# Patient Record
Sex: Female | Born: 1960 | Race: White | Hispanic: No | Marital: Single | Smoking: Current every day smoker
Health system: Southern US, Community
[De-identification: ages and names within clinical notes are randomized; demographics above are authoritative.]

## PROBLEM LIST (undated history)

## (undated) DIAGNOSIS — G4733 Obstructive sleep apnea (adult) (pediatric): Secondary | ICD-10-CM

## (undated) DIAGNOSIS — F32A Depression, unspecified: Secondary | ICD-10-CM

## (undated) DIAGNOSIS — I639 Cerebral infarction, unspecified: Secondary | ICD-10-CM

## (undated) DIAGNOSIS — K900 Celiac disease: Secondary | ICD-10-CM

## (undated) DIAGNOSIS — E039 Hypothyroidism, unspecified: Secondary | ICD-10-CM

## (undated) DIAGNOSIS — K219 Gastro-esophageal reflux disease without esophagitis: Secondary | ICD-10-CM

## (undated) DIAGNOSIS — E119 Type 2 diabetes mellitus without complications: Secondary | ICD-10-CM

## (undated) DIAGNOSIS — M5136 Other intervertebral disc degeneration, lumbar region: Secondary | ICD-10-CM

## (undated) DIAGNOSIS — J45909 Unspecified asthma, uncomplicated: Secondary | ICD-10-CM

## (undated) DIAGNOSIS — I10 Essential (primary) hypertension: Secondary | ICD-10-CM

## (undated) DIAGNOSIS — E7800 Pure hypercholesterolemia, unspecified: Secondary | ICD-10-CM

## (undated) DIAGNOSIS — M1990 Unspecified osteoarthritis, unspecified site: Secondary | ICD-10-CM

## (undated) DIAGNOSIS — B029 Zoster without complications: Secondary | ICD-10-CM

## (undated) DIAGNOSIS — R0602 Shortness of breath: Secondary | ICD-10-CM

## (undated) DIAGNOSIS — K649 Unspecified hemorrhoids: Secondary | ICD-10-CM

## (undated) DIAGNOSIS — I2510 Atherosclerotic heart disease of native coronary artery without angina pectoris: Secondary | ICD-10-CM

## (undated) DIAGNOSIS — F329 Major depressive disorder, single episode, unspecified: Secondary | ICD-10-CM

## (undated) DIAGNOSIS — Z87442 Personal history of urinary calculi: Secondary | ICD-10-CM

## (undated) DIAGNOSIS — G5600 Carpal tunnel syndrome, unspecified upper limb: Secondary | ICD-10-CM

## (undated) DIAGNOSIS — I739 Peripheral vascular disease, unspecified: Secondary | ICD-10-CM

## (undated) DIAGNOSIS — J449 Chronic obstructive pulmonary disease, unspecified: Secondary | ICD-10-CM

## (undated) DIAGNOSIS — R202 Paresthesia of skin: Secondary | ICD-10-CM

## (undated) HISTORY — DX: Obstructive sleep apnea (adult) (pediatric): G47.33

## (undated) HISTORY — DX: Atherosclerotic heart disease of native coronary artery without angina pectoris: I25.10

## (undated) HISTORY — DX: Zoster without complications: B02.9

## (undated) HISTORY — DX: Other intervertebral disc degeneration, lumbar region: M51.36

## (undated) HISTORY — DX: Pure hypercholesterolemia, unspecified: E78.00

## (undated) HISTORY — DX: Paresthesia of skin: R20.2

## (undated) HISTORY — DX: Peripheral vascular disease, unspecified: I73.9

## (undated) HISTORY — DX: Celiac disease: K90.0

## (undated) HISTORY — PX: CYSTOSCOPY: SUR368

## (undated) HISTORY — DX: Carpal tunnel syndrome, unspecified upper limb: G56.00

## (undated) HISTORY — PX: URETERAL STENT PLACEMENT: SHX822

## (undated) HISTORY — PX: INCONTINENCE SURGERY: SHX676

---

## 2006-03-18 HISTORY — PX: ABDOMINAL HYSTERECTOMY: SHX81

## 2008-02-18 DIAGNOSIS — K649 Unspecified hemorrhoids: Secondary | ICD-10-CM

## 2008-02-18 HISTORY — DX: Unspecified hemorrhoids: K64.9

## 2008-09-04 HISTORY — PX: CHOLECYSTECTOMY: SHX55

## 2010-03-16 ENCOUNTER — Encounter: Admission: RE | Admit: 2010-03-16 | Discharge: 2010-03-16 | Payer: Self-pay | Admitting: Neurosurgery

## 2010-03-30 ENCOUNTER — Encounter: Admission: RE | Admit: 2010-03-30 | Discharge: 2010-03-30 | Payer: Self-pay | Admitting: Neurosurgery

## 2010-09-19 ENCOUNTER — Other Ambulatory Visit: Payer: Self-pay | Admitting: Neurosurgery

## 2010-09-19 DIAGNOSIS — M545 Low back pain: Secondary | ICD-10-CM

## 2010-09-21 ENCOUNTER — Ambulatory Visit
Admission: RE | Admit: 2010-09-21 | Discharge: 2010-09-21 | Disposition: A | Discharge status: Home / Self Care | Payer: Medicaid Other | Source: Ambulatory Visit | Attending: Neurosurgery | Admitting: Neurosurgery

## 2010-09-21 DIAGNOSIS — M545 Low back pain: Secondary | ICD-10-CM

## 2013-06-25 DIAGNOSIS — E119 Type 2 diabetes mellitus without complications: Secondary | ICD-10-CM | POA: Insufficient documentation

## 2014-02-09 ENCOUNTER — Other Ambulatory Visit: Payer: Self-pay | Admitting: Neurosurgery

## 2014-02-24 ENCOUNTER — Encounter (HOSPITAL_COMMUNITY)
Admission: RE | Admit: 2014-02-24 | Discharge: 2014-02-24 | Disposition: A | Discharge status: Home / Self Care | Payer: Medicare Other | Source: Ambulatory Visit | Attending: Neurosurgery | Admitting: Neurosurgery

## 2014-02-24 ENCOUNTER — Encounter (HOSPITAL_COMMUNITY): Payer: Self-pay

## 2014-02-24 ENCOUNTER — Encounter (HOSPITAL_COMMUNITY): Payer: Self-pay | Admitting: Pharmacy Technician

## 2014-02-24 DIAGNOSIS — Z01812 Encounter for preprocedural laboratory examination: Secondary | ICD-10-CM | POA: Diagnosis not present

## 2014-02-24 HISTORY — DX: Unspecified hemorrhoids: K64.9

## 2014-02-24 HISTORY — DX: Gastro-esophageal reflux disease without esophagitis: K21.9

## 2014-02-24 HISTORY — DX: Cerebral infarction, unspecified: I63.9

## 2014-02-24 HISTORY — DX: Depression, unspecified: F32.A

## 2014-02-24 HISTORY — DX: Chronic obstructive pulmonary disease, unspecified: J44.9

## 2014-02-24 HISTORY — DX: Essential (primary) hypertension: I10

## 2014-02-24 HISTORY — DX: Major depressive disorder, single episode, unspecified: F32.9

## 2014-02-24 HISTORY — DX: Hypothyroidism, unspecified: E03.9

## 2014-02-24 HISTORY — DX: Unspecified osteoarthritis, unspecified site: M19.90

## 2014-02-24 HISTORY — DX: Type 2 diabetes mellitus without complications: E11.9

## 2014-02-24 HISTORY — DX: Unspecified asthma, uncomplicated: J45.909

## 2014-02-24 HISTORY — DX: Shortness of breath: R06.02

## 2014-02-24 HISTORY — DX: Personal history of urinary calculi: Z87.442

## 2014-02-24 LAB — CBC
HEMATOCRIT: 39.7 % (ref 36.0–46.0)
Hemoglobin: 13.1 g/dL (ref 12.0–15.0)
MCH: 30.3 pg (ref 26.0–34.0)
MCHC: 33 g/dL (ref 30.0–36.0)
MCV: 91.9 fL (ref 78.0–100.0)
Platelets: 236 10*3/uL (ref 150–400)
RBC: 4.32 MIL/uL (ref 3.87–5.11)
RDW: 13.5 % (ref 11.5–15.5)
WBC: 11.3 10*3/uL — ABNORMAL HIGH (ref 4.0–10.5)

## 2014-02-24 LAB — BASIC METABOLIC PANEL
Anion gap: 16 — ABNORMAL HIGH (ref 5–15)
BUN: 17 mg/dL (ref 6–23)
CALCIUM: 9.2 mg/dL (ref 8.4–10.5)
CHLORIDE: 96 meq/L (ref 96–112)
CO2: 28 mEq/L (ref 19–32)
Creatinine, Ser: 1.01 mg/dL (ref 0.50–1.10)
GFR calc Af Amer: 72 mL/min — ABNORMAL LOW (ref >90)
GFR calc non Af Amer: 62 mL/min — ABNORMAL LOW (ref >90)
GLUCOSE: 297 mg/dL — AB (ref 70–99)
Potassium: 4.2 mEq/L (ref 3.7–5.3)
Sodium: 140 mEq/L (ref 137–147)

## 2014-02-24 LAB — SURGICAL PCR SCREEN
MRSA, PCR: NEGATIVE
STAPHYLOCOCCUS AUREUS: NEGATIVE

## 2014-02-24 NOTE — Pre-Procedure Instructions (Signed)
Sharon Calderon  02/24/2014   Your procedure is scheduled on: Friday, July 24.  [facility] at 9:15 AM.  Call this number if you have problems the morning of surgery: [phone]   Remember:   Do not eat food or drink liquids after midnight Thursday, July 23.   Take these medicines the morning of surgery with A SIP OF WATER: -   Do not wear jewelry, make-up or nail polish.  Do not wear lotions, powders, or perfumes.   Do not shave 48 hours prior to surgery.   Do not bring valuables to the hospital.             [facility] is not responsible for any belongings or valuables.               Contacts, dentures or bridgework may not be worn into surgery.  Leave suitcase in the car. After surgery it may be brought to your room.  For patients admitted to the hospital, discharge time is determined by your treatment team.               Patients discharged the day of surgery will not be allowed to drive home.  Name and phone number of your driver: -   Special Instructions: Review  [place] - Preparing For Surgery.   Please read over the following fact sheets that you were given: Pain Booklet, Coughing and Deep Breathing and Surgical Site Infection Prevention

## 2014-02-24 NOTE — Pre-Procedure Instructions (Addendum)
Sharon Calderon  02/24/2014   Your procedure is scheduled on: Friday, July 24.  [facility] at 9:15 AM.  Call this number if you have problems the morning of surgery: [phone]   Remember:   Do not eat food or drink liquids after midnight Thursday, July 23.   Take these medicines the morning of surgery with A SIP OF WATER: divalproex (DEPAKOTE ER), levothyroxine (SYNTHROID, LEVOTHROID), loratadine (CLARITIN),metoprolol tartrate (LOPRESSOR), omeprazole (PRILOSEC).               May take oxyCODONE-acetaminophen (PERCOCET/ROXICET) if needed.         Do not take any Aspirin, NSAID's (Advil, Ibuprofen, Aleve).        Phentermine should be stopped 2 weeks prior to surgery.     Stop taking Aspirin, Coumadin, Plavix, Effient and Herbal medications.  Do not take any NSAIDs ie: Ibuprofen,  Advil,Naproxen or any medication containing Aspirin.   Do not wear jewelry, make-up or nail polish.  Do not wear lotions, powders, or perfumes.   Do not shave 48 hours prior to surgery.   Do not bring valuables to the hospital.             [facility] is not [facility] for any belongings or valuables.               Contacts, dentures or bridgework may not be worn into surgery.  Leave suitcase in the car. After surgery it may be brought to your room.  For patients admitted to the hospital, discharge time is determined by your treatment team.               Patients discharged the day of surgery will not be allowed to drive home.  Name and phone number of your driver: -   Special Instructions: Review   - Preparing For Surgery.   Please read over the following fact sheets that you were given: Pain Booklet, Coughing and Deep Breathing and Surgical Site Infection Prevention

## 2014-02-24 NOTE — Progress Notes (Signed)
Sharon Calderon denies chest pain, does get SOB when walking has COPD.  [facility]. Patient reports that she has had  A TEE, EKG and Stress test in the past year.  Patient has had a sleep study in the past 2 years, I faxed request for all of these reports.  Sharon Calderon is on Phenetamine and took a dose today.  I instructed patient to not take any more. I notified Revonda Standardllison, who said that the anesthesia dept has said stopping Phenetamine 1 week prior to surgery is acceptable.

## 2014-02-25 NOTE — Progress Notes (Signed)
[facility],Stress,Echo.

## 2014-03-03 MED ORDER — CEFAZOLIN SODIUM-DEXTROSE 2-3 GM-% IV SOLR
2.0000 g | INTRAVENOUS | Status: AC
  Administered 2014-03-04: 2 g via INTRAVENOUS
  Filled 2014-03-03: qty 50

## 2014-03-04 ENCOUNTER — Observation Stay (HOSPITAL_COMMUNITY)
Admission: RE | Admit: 2014-03-04 | Discharge: 2014-03-05 | Disposition: A | Discharge status: Home / Self Care | Payer: Medicare Other | Source: Ambulatory Visit | Attending: Neurosurgery | Admitting: Neurosurgery

## 2014-03-04 ENCOUNTER — Encounter (HOSPITAL_COMMUNITY): Payer: Medicare Other | Admitting: Anesthesiology

## 2014-03-04 ENCOUNTER — Encounter (HOSPITAL_COMMUNITY)
Admission: RE | Disposition: A | Discharge status: Home / Self Care | Payer: Self-pay | Source: Ambulatory Visit | Attending: Neurosurgery

## 2014-03-04 ENCOUNTER — Inpatient Hospital Stay (HOSPITAL_COMMUNITY): Payer: Medicare Other

## 2014-03-04 ENCOUNTER — Encounter (HOSPITAL_COMMUNITY): Payer: Self-pay | Admitting: *Deleted

## 2014-03-04 ENCOUNTER — Inpatient Hospital Stay (HOSPITAL_COMMUNITY): Payer: Medicare Other | Admitting: Anesthesiology

## 2014-03-04 DIAGNOSIS — Z6839 Body mass index (BMI) 39.0-39.9, adult: Secondary | ICD-10-CM | POA: Diagnosis not present

## 2014-03-04 DIAGNOSIS — F3289 Other specified depressive episodes: Secondary | ICD-10-CM | POA: Diagnosis not present

## 2014-03-04 DIAGNOSIS — I10 Essential (primary) hypertension: Secondary | ICD-10-CM | POA: Insufficient documentation

## 2014-03-04 DIAGNOSIS — M47817 Spondylosis without myelopathy or radiculopathy, lumbosacral region: Secondary | ICD-10-CM | POA: Diagnosis not present

## 2014-03-04 DIAGNOSIS — M5126 Other intervertebral disc displacement, lumbar region: Secondary | ICD-10-CM | POA: Diagnosis present

## 2014-03-04 DIAGNOSIS — E039 Hypothyroidism, unspecified: Secondary | ICD-10-CM | POA: Diagnosis not present

## 2014-03-04 DIAGNOSIS — E119 Type 2 diabetes mellitus without complications: Secondary | ICD-10-CM | POA: Insufficient documentation

## 2014-03-04 DIAGNOSIS — J45909 Unspecified asthma, uncomplicated: Secondary | ICD-10-CM | POA: Diagnosis not present

## 2014-03-04 DIAGNOSIS — K219 Gastro-esophageal reflux disease without esophagitis: Secondary | ICD-10-CM | POA: Diagnosis not present

## 2014-03-04 DIAGNOSIS — F329 Major depressive disorder, single episode, unspecified: Secondary | ICD-10-CM | POA: Diagnosis not present

## 2014-03-04 DIAGNOSIS — M48061 Spinal stenosis, lumbar region without neurogenic claudication: Secondary | ICD-10-CM | POA: Diagnosis present

## 2014-03-04 DIAGNOSIS — Z8673 Personal history of transient ischemic attack (TIA), and cerebral infarction without residual deficits: Secondary | ICD-10-CM | POA: Diagnosis not present

## 2014-03-04 DIAGNOSIS — F17200 Nicotine dependence, unspecified, uncomplicated: Secondary | ICD-10-CM | POA: Diagnosis not present

## 2014-03-04 DIAGNOSIS — Z7982 Long term (current) use of aspirin: Secondary | ICD-10-CM | POA: Insufficient documentation

## 2014-03-04 HISTORY — PX: LUMBAR LAMINECTOMY/DECOMPRESSION MICRODISCECTOMY: SHX5026

## 2014-03-04 LAB — GLUCOSE, CAPILLARY
GLUCOSE-CAPILLARY: 190 mg/dL — AB (ref 70–99)
GLUCOSE-CAPILLARY: 155 mg/dL — AB (ref 70–99)
GLUCOSE-CAPILLARY: 168 mg/dL — AB (ref 70–99)
Glucose-Capillary: 165 mg/dL — ABNORMAL HIGH (ref 70–99)

## 2014-03-04 SURGERY — LUMBAR LAMINECTOMY/DECOMPRESSION MICRODISCECTOMY 3 LEVELS
Anesthesia: General | Laterality: Bilateral

## 2014-03-04 MED ORDER — ALBUTEROL SULFATE HFA 108 (90 BASE) MCG/ACT IN AERS
INHALATION_SPRAY | RESPIRATORY_TRACT | Status: AC
  Filled 2014-03-04: qty 6.7

## 2014-03-04 MED ORDER — SODIUM CHLORIDE 0.9 % IJ SOLN
3.0000 mL | Freq: Two times a day (BID) | INTRAMUSCULAR | Status: DC
  Administered 2014-03-04: 3 mL via INTRAVENOUS

## 2014-03-04 MED ORDER — ACETAMINOPHEN 650 MG RE SUPP: 650.0000 mg | RECTAL | Status: DC | PRN

## 2014-03-04 MED ORDER — FENTANYL CITRATE 0.05 MG/ML IJ SOLN
INTRAMUSCULAR | Status: DC | PRN
  Administered 2014-03-04: 50 ug via INTRAVENOUS
  Administered 2014-03-04: 100 ug via INTRAVENOUS

## 2014-03-04 MED ORDER — ROCURONIUM BROMIDE 100 MG/10ML IV SOLN
INTRAVENOUS | Status: DC | PRN
  Administered 2014-03-04: 10 mg via INTRAVENOUS
  Administered 2014-03-04: 30 mg via INTRAVENOUS

## 2014-03-04 MED ORDER — OXYCODONE-ACETAMINOPHEN 5-325 MG PO TABS
1.0000 | ORAL_TABLET | ORAL | Status: DC | PRN
  Administered 2014-03-05: 2 via ORAL
  Administered 2014-03-05: 1 via ORAL
  Filled 2014-03-04: qty 2
  Filled 2014-03-04: qty 1

## 2014-03-04 MED ORDER — PHENYLEPHRINE HCL 10 MG/ML IJ SOLN
10.0000 mg | INTRAVENOUS | Status: DC | PRN
  Administered 2014-03-04: 40 ug/min via INTRAVENOUS

## 2014-03-04 MED ORDER — PHENOL 1.4 % MT LIQD: 1.0000 | OROMUCOSAL | Status: DC | PRN

## 2014-03-04 MED ORDER — MIRABEGRON ER 50 MG PO TB24
50.0000 mg | ORAL_TABLET | Freq: Every day | ORAL | Status: DC
  Administered 2014-03-04 – 2014-03-05 (×2): 50 mg via ORAL
  Filled 2014-03-04 (×2): qty 1

## 2014-03-04 MED ORDER — GLYCOPYRROLATE 0.2 MG/ML IJ SOLN
INTRAMUSCULAR | Status: DC | PRN
  Administered 2014-03-04: 0.6 mg via INTRAVENOUS

## 2014-03-04 MED ORDER — ARTIFICIAL TEARS OP OINT
TOPICAL_OINTMENT | OPHTHALMIC | Status: AC
  Filled 2014-03-04: qty 3.5

## 2014-03-04 MED ORDER — SENNA 8.6 MG PO TABS
1.0000 | ORAL_TABLET | Freq: Two times a day (BID) | ORAL | Status: DC
  Administered 2014-03-04 – 2014-03-05 (×2): 8.6 mg via ORAL
  Filled 2014-03-04 (×2): qty 1

## 2014-03-04 MED ORDER — HYDROMORPHONE HCL PF 1 MG/ML IJ SOLN
0.2500 mg | INTRAMUSCULAR | Status: DC | PRN
  Administered 2014-03-04 (×2): 0.5 mg via INTRAVENOUS

## 2014-03-04 MED ORDER — KETOROLAC TROMETHAMINE 30 MG/ML IJ SOLN
30.0000 mg | Freq: Four times a day (QID) | INTRAMUSCULAR | Status: DC
  Administered 2014-03-04 (×2): 30 mg via INTRAVENOUS
  Filled 2014-03-04 (×8): qty 1

## 2014-03-04 MED ORDER — PROPOFOL 10 MG/ML IV BOLUS
INTRAVENOUS | Status: AC
  Filled 2014-03-04: qty 20

## 2014-03-04 MED ORDER — ROCURONIUM BROMIDE 50 MG/5ML IV SOLN
INTRAVENOUS | Status: AC
  Filled 2014-03-04: qty 1

## 2014-03-04 MED ORDER — CALCIUM CARBONATE 1250 (500 CA) MG PO TABS
1.0000 | ORAL_TABLET | Freq: Every day | ORAL | Status: DC
  Administered 2014-03-05: 500 mg via ORAL
  Filled 2014-03-04 (×2): qty 1

## 2014-03-04 MED ORDER — POLYETHYLENE GLYCOL 3350 17 G PO PACK
17.0000 g | PACK | Freq: Every day | ORAL | Status: DC | PRN
  Filled 2014-03-04: qty 1

## 2014-03-04 MED ORDER — LACTATED RINGERS IV SOLN
INTRAVENOUS | Status: DC | PRN
  Administered 2014-03-04: 13:00:00 via INTRAVENOUS

## 2014-03-04 MED ORDER — MIDAZOLAM HCL 5 MG/5ML IJ SOLN
INTRAMUSCULAR | Status: DC | PRN
  Administered 2014-03-04 (×2): 1 mg via INTRAVENOUS

## 2014-03-04 MED ORDER — METOPROLOL TARTRATE 25 MG PO TABS
25.0000 mg | ORAL_TABLET | Freq: Two times a day (BID) | ORAL | Status: DC
  Administered 2014-03-04 – 2014-03-05 (×2): 25 mg via ORAL
  Filled 2014-03-04 (×3): qty 1

## 2014-03-04 MED ORDER — LIDOCAINE-EPINEPHRINE 0.5 %-1:200000 IJ SOLN
INTRAMUSCULAR | Status: DC | PRN
  Administered 2014-03-04: 10 mL

## 2014-03-04 MED ORDER — ACETAMINOPHEN 325 MG PO TABS: 650.0000 mg | ORAL_TABLET | ORAL | Status: DC | PRN

## 2014-03-04 MED ORDER — 0.9 % SODIUM CHLORIDE (POUR BTL) OPTIME
TOPICAL | Status: DC | PRN
  Administered 2014-03-04: 1000 mL

## 2014-03-04 MED ORDER — PHENYLEPHRINE HCL 10 MG/ML IJ SOLN
INTRAMUSCULAR | Status: DC | PRN
  Administered 2014-03-04 (×4): 80 ug via INTRAVENOUS

## 2014-03-04 MED ORDER — LIDOCAINE HCL (CARDIAC) 20 MG/ML IV SOLN
INTRAVENOUS | Status: AC
  Filled 2014-03-04: qty 5

## 2014-03-04 MED ORDER — ONDANSETRON HCL 4 MG/2ML IJ SOLN
INTRAMUSCULAR | Status: DC | PRN
  Administered 2014-03-04: 4 mg via INTRAVENOUS

## 2014-03-04 MED ORDER — HEMOSTATIC AGENTS (NO CHARGE) OPTIME
TOPICAL | Status: DC | PRN
  Administered 2014-03-04: 1 via TOPICAL

## 2014-03-04 MED ORDER — FENTANYL CITRATE 0.05 MG/ML IJ SOLN
INTRAMUSCULAR | Status: AC
  Filled 2014-03-04: qty 5

## 2014-03-04 MED ORDER — MIDAZOLAM HCL 2 MG/2ML IJ SOLN
INTRAMUSCULAR | Status: AC
  Filled 2014-03-04: qty 2

## 2014-03-04 MED ORDER — MESALAMINE 1.2 G PO TBEC
1.2000 g | DELAYED_RELEASE_TABLET | Freq: Two times a day (BID) | ORAL | Status: DC
  Administered 2014-03-04 – 2014-03-05 (×2): 1.2 g via ORAL
  Filled 2014-03-04 (×3): qty 1

## 2014-03-04 MED ORDER — PANTOPRAZOLE SODIUM 40 MG PO TBEC
80.0000 mg | DELAYED_RELEASE_TABLET | Freq: Every day | ORAL | Status: DC
  Administered 2014-03-04 – 2014-03-05 (×2): 80 mg via ORAL
  Filled 2014-03-04 (×2): qty 2

## 2014-03-04 MED ORDER — SODIUM CHLORIDE 0.9 % IJ SOLN: 3.0000 mL | INTRAMUSCULAR | Status: DC | PRN

## 2014-03-04 MED ORDER — PROPOFOL 10 MG/ML IV BOLUS
INTRAVENOUS | Status: DC | PRN
  Administered 2014-03-04: 200 mg via INTRAVENOUS

## 2014-03-04 MED ORDER — NEOSTIGMINE METHYLSULFATE 10 MG/10ML IV SOLN
INTRAVENOUS | Status: AC
  Filled 2014-03-04: qty 1

## 2014-03-04 MED ORDER — ALBUTEROL SULFATE HFA 108 (90 BASE) MCG/ACT IN AERS
INHALATION_SPRAY | RESPIRATORY_TRACT | Status: DC | PRN
  Administered 2014-03-04: 2 via RESPIRATORY_TRACT

## 2014-03-04 MED ORDER — DIVALPROEX SODIUM ER 500 MG PO TB24
1000.0000 mg | ORAL_TABLET | Freq: Every day | ORAL | Status: DC
  Administered 2014-03-05: 1000 mg via ORAL
  Filled 2014-03-04 (×2): qty 2

## 2014-03-04 MED ORDER — HYDROMORPHONE HCL PF 1 MG/ML IJ SOLN
INTRAMUSCULAR | Status: AC
  Administered 2014-03-04: 0.5 mg via INTRAVENOUS
  Filled 2014-03-04: qty 1

## 2014-03-04 MED ORDER — HYDROCODONE-ACETAMINOPHEN 5-325 MG PO TABS: 1.0000 | ORAL_TABLET | ORAL | Status: DC | PRN

## 2014-03-04 MED ORDER — ARTIFICIAL TEARS OP OINT
TOPICAL_OINTMENT | OPHTHALMIC | Status: DC | PRN
  Administered 2014-03-04: 1 via OPHTHALMIC

## 2014-03-04 MED ORDER — ATORVASTATIN CALCIUM 40 MG PO TABS
40.0000 mg | ORAL_TABLET | Freq: Every day | ORAL | Status: DC
  Administered 2014-03-04 – 2014-03-05 (×2): 40 mg via ORAL
  Filled 2014-03-04 (×2): qty 1

## 2014-03-04 MED ORDER — GLYCOPYRROLATE 0.2 MG/ML IJ SOLN
INTRAMUSCULAR | Status: AC
  Filled 2014-03-04: qty 3

## 2014-03-04 MED ORDER — METFORMIN HCL 500 MG PO TABS
1000.0000 mg | ORAL_TABLET | Freq: Two times a day (BID) | ORAL | Status: DC
  Administered 2014-03-05: 1000 mg via ORAL
  Filled 2014-03-04 (×3): qty 2

## 2014-03-04 MED ORDER — NEOSTIGMINE METHYLSULFATE 10 MG/10ML IV SOLN
INTRAVENOUS | Status: DC | PRN
  Administered 2014-03-04: 4 mg via INTRAVENOUS

## 2014-03-04 MED ORDER — DIAZEPAM 5 MG PO TABS
5.0000 mg | ORAL_TABLET | Freq: Four times a day (QID) | ORAL | Status: DC | PRN
  Administered 2014-03-05 (×2): 5 mg via ORAL
  Filled 2014-03-04 (×2): qty 1

## 2014-03-04 MED ORDER — CALCIUM CARBONATE 600 MG PO TABS: 600.0000 mg | ORAL_TABLET | Freq: Every day | ORAL | Status: DC

## 2014-03-04 MED ORDER — ASPIRIN EC 81 MG PO TBEC
81.0000 mg | DELAYED_RELEASE_TABLET | Freq: Every day | ORAL | Status: DC
  Administered 2014-03-04 – 2014-03-05 (×2): 81 mg via ORAL
  Filled 2014-03-04 (×2): qty 1

## 2014-03-04 MED ORDER — THROMBIN 5000 UNITS EX SOLR
CUTANEOUS | Status: DC | PRN
  Administered 2014-03-04 (×2): 5000 [IU] via TOPICAL

## 2014-03-04 MED ORDER — KETOROLAC TROMETHAMINE 30 MG/ML IJ SOLN
INTRAMUSCULAR | Status: AC
  Filled 2014-03-04: qty 1

## 2014-03-04 MED ORDER — LIDOCAINE HCL (CARDIAC) 20 MG/ML IV SOLN
INTRAVENOUS | Status: DC | PRN
  Administered 2014-03-04: 80 mg via INTRAVENOUS

## 2014-03-04 MED ORDER — TEMAZEPAM 30 MG PO CAPS: 30.0000 mg | ORAL_CAPSULE | Freq: Every evening | ORAL | Status: DC | PRN

## 2014-03-04 MED ORDER — ONDANSETRON HCL 4 MG/2ML IJ SOLN: 4.0000 mg | Freq: Once | INTRAMUSCULAR | Status: DC | PRN

## 2014-03-04 MED ORDER — EPHEDRINE SULFATE 50 MG/ML IJ SOLN
INTRAMUSCULAR | Status: AC
  Filled 2014-03-04: qty 1

## 2014-03-04 MED ORDER — POTASSIUM CHLORIDE IN NACL 20-0.9 MEQ/L-% IV SOLN
INTRAVENOUS | Status: DC
  Filled 2014-03-04 (×3): qty 1000

## 2014-03-04 MED ORDER — LACTATED RINGERS IV SOLN
INTRAVENOUS | Status: DC
  Administered 2014-03-04: 10:00:00 via INTRAVENOUS

## 2014-03-04 MED ORDER — ONDANSETRON HCL 4 MG/2ML IJ SOLN
INTRAMUSCULAR | Status: AC
  Filled 2014-03-04: qty 2

## 2014-03-04 MED ORDER — GLIPIZIDE 5 MG PO TABS
5.0000 mg | ORAL_TABLET | Freq: Two times a day (BID) | ORAL | Status: DC
  Administered 2014-03-05: 5 mg via ORAL
  Filled 2014-03-04 (×3): qty 1

## 2014-03-04 MED ORDER — LEVOTHYROXINE SODIUM 50 MCG PO TABS
50.0000 ug | ORAL_TABLET | Freq: Every day | ORAL | Status: DC
  Administered 2014-03-05: 50 ug via ORAL
  Filled 2014-03-04 (×2): qty 1

## 2014-03-04 MED ORDER — MENTHOL 3 MG MT LOZG: 1.0000 | LOZENGE | OROMUCOSAL | Status: DC | PRN

## 2014-03-04 MED ORDER — LISINOPRIL 5 MG PO TABS
5.0000 mg | ORAL_TABLET | Freq: Two times a day (BID) | ORAL | Status: DC
  Administered 2014-03-04 – 2014-03-05 (×2): 5 mg via ORAL
  Filled 2014-03-04 (×3): qty 1

## 2014-03-04 MED ORDER — LORATADINE 10 MG PO TABS
10.0000 mg | ORAL_TABLET | Freq: Every day | ORAL | Status: DC
  Administered 2014-03-04 – 2014-03-05 (×2): 10 mg via ORAL
  Filled 2014-03-04 (×2): qty 1

## 2014-03-04 MED ORDER — ONDANSETRON HCL 4 MG/2ML IJ SOLN
4.0000 mg | INTRAMUSCULAR | Status: DC | PRN
Start: 2014-03-04 — End: 2014-03-05

## 2014-03-04 MED ORDER — VILAZODONE HCL 40 MG PO TABS
40.0000 mg | ORAL_TABLET | Freq: Every day | ORAL | Status: DC
  Administered 2014-03-04 – 2014-03-05 (×2): 40 mg via ORAL
  Filled 2014-03-04 (×3): qty 1

## 2014-03-04 MED ORDER — MORPHINE SULFATE 2 MG/ML IJ SOLN: 1.0000 mg | INTRAMUSCULAR | Status: DC | PRN

## 2014-03-04 SURGICAL SUPPLY — 61 items
BAG DECANTER FOR FLEXI CONT (MISCELLANEOUS) ×3 IMPLANT
BENZOIN TINCTURE PRP APPL 2/3 (GAUZE/BANDAGES/DRESSINGS) IMPLANT
BLADE 10 SAFETY STRL DISP (BLADE) ×3 IMPLANT
BLADE SURG ROTATE 9660 (MISCELLANEOUS) IMPLANT
BUR MATCHSTICK NEURO 3.0 LAGG (BURR) ×3 IMPLANT
BUR ROUND FLUTED 4 SOFT TCH (BURR) ×2 IMPLANT
BUR ROUND FLUTED 4MM SOFT TCH (BURR) ×1
CANISTER SUCT 3000ML (MISCELLANEOUS) ×3 IMPLANT
CLOSURE WOUND 1/2 X4 (GAUZE/BANDAGES/DRESSINGS)
CONT SPEC 4OZ CLIKSEAL STRL BL (MISCELLANEOUS) ×3 IMPLANT
DECANTER SPIKE VIAL GLASS SM (MISCELLANEOUS) ×3 IMPLANT
DERMABOND ADHESIVE PROPEN (GAUZE/BANDAGES/DRESSINGS) ×2
DERMABOND ADVANCED (GAUZE/BANDAGES/DRESSINGS) ×2
DERMABOND ADVANCED .7 DNX12 (GAUZE/BANDAGES/DRESSINGS) ×1 IMPLANT
DERMABOND ADVANCED .7 DNX6 (GAUZE/BANDAGES/DRESSINGS) ×1 IMPLANT
DRAPE LAPAROTOMY 100X72X124 (DRAPES) ×3 IMPLANT
DRAPE MICROSCOPE LEICA (MISCELLANEOUS) ×3 IMPLANT
DRAPE POUCH INSTRU U-SHP 10X18 (DRAPES) ×3 IMPLANT
DRAPE SURG 17X23 STRL (DRAPES) ×3 IMPLANT
DURAPREP 26ML APPLICATOR (WOUND CARE) ×3 IMPLANT
ELECT REM PT RETURN 9FT ADLT (ELECTROSURGICAL) ×3
ELECTRODE REM PT RTRN 9FT ADLT (ELECTROSURGICAL) ×1 IMPLANT
GAUZE SPONGE 4X4 16PLY XRAY LF (GAUZE/BANDAGES/DRESSINGS) ×3 IMPLANT
GLOVE BIOGEL PI IND STRL 7.0 (GLOVE) ×3 IMPLANT
GLOVE BIOGEL PI IND STRL 7.5 (GLOVE) ×1 IMPLANT
GLOVE BIOGEL PI INDICATOR 7.0 (GLOVE) ×6
GLOVE BIOGEL PI INDICATOR 7.5 (GLOVE) ×2
GLOVE ECLIPSE 6.5 STRL STRAW (GLOVE) ×9 IMPLANT
GLOVE ECLIPSE 7.0 STRL STRAW (GLOVE) ×3 IMPLANT
GLOVE EXAM NITRILE LRG STRL (GLOVE) IMPLANT
GLOVE EXAM NITRILE MD LF STRL (GLOVE) IMPLANT
GLOVE EXAM NITRILE XL STR (GLOVE) IMPLANT
GLOVE EXAM NITRILE XS STR PU (GLOVE) IMPLANT
GLOVE SS BIOGEL STRL SZ 6.5 (GLOVE) ×3 IMPLANT
GLOVE SUPERSENSE BIOGEL SZ 6.5 (GLOVE) ×6
GLOVE SURG SS PI 7.0 STRL IVOR (GLOVE) ×9 IMPLANT
GOWN STRL REUS W/ TWL LRG LVL3 (GOWN DISPOSABLE) ×3 IMPLANT
GOWN STRL REUS W/ TWL XL LVL3 (GOWN DISPOSABLE) ×1 IMPLANT
GOWN STRL REUS W/TWL 2XL LVL3 (GOWN DISPOSABLE) IMPLANT
GOWN STRL REUS W/TWL LRG LVL3 (GOWN DISPOSABLE) ×6
GOWN STRL REUS W/TWL XL LVL3 (GOWN DISPOSABLE) ×2
KIT BASIN OR (CUSTOM PROCEDURE TRAY) ×3 IMPLANT
KIT ROOM TURNOVER OR (KITS) ×3 IMPLANT
NEEDLE HYPO 25X1 1.5 SAFETY (NEEDLE) ×3 IMPLANT
NEEDLE SPNL 18GX3.5 QUINCKE PK (NEEDLE) IMPLANT
NS IRRIG 1000ML POUR BTL (IV SOLUTION) ×3 IMPLANT
PACK LAMINECTOMY NEURO (CUSTOM PROCEDURE TRAY) ×3 IMPLANT
PAD ARMBOARD 7.5X6 YLW CONV (MISCELLANEOUS) ×9 IMPLANT
RUBBERBAND STERILE (MISCELLANEOUS) ×6 IMPLANT
SPONGE GAUZE 4X4 12PLY (GAUZE/BANDAGES/DRESSINGS) IMPLANT
SPONGE LAP 4X18 X RAY DECT (DISPOSABLE) IMPLANT
SPONGE SURGIFOAM ABS GEL SZ50 (HEMOSTASIS) ×3 IMPLANT
STRIP CLOSURE SKIN 1/2X4 (GAUZE/BANDAGES/DRESSINGS) IMPLANT
SUT VIC AB 0 CT1 18XCR BRD8 (SUTURE) ×1 IMPLANT
SUT VIC AB 0 CT1 8-18 (SUTURE) ×2
SUT VIC AB 2-0 CT1 18 (SUTURE) ×3 IMPLANT
SUT VIC AB 3-0 SH 8-18 (SUTURE) ×3 IMPLANT
SYR 20ML ECCENTRIC (SYRINGE) ×3 IMPLANT
TOWEL OR 17X24 6PK STRL BLUE (TOWEL DISPOSABLE) ×3 IMPLANT
TOWEL OR 17X26 10 PK STRL BLUE (TOWEL DISPOSABLE) ×3 IMPLANT
WATER STERILE IRR 1000ML POUR (IV SOLUTION) ×3 IMPLANT

## 2014-03-04 NOTE — Plan of Care (Signed)
Problem: Consults Goal: Diagnosis - Spinal Surgery Outcome: Completed/Met Date Met:  03/04/14 Lumbar Laminectomy (Complex)

## 2014-03-04 NOTE — H&P (Signed)
BP 113/74  Pulse 92  Temp(Src) 97.7 F (36.5 C) (Oral)  Resp 20  Ht 4\' 11"  (1.499 m)  Wt 89.585 kg (197 lb 8 oz)  BMI 39.87 kg/m2  SpO2 98% HOPI:                                                   Sharon Calderon comes in with complaints of pain in the lower back and lower extremities.  The MRI shows stenosis present at L1-2, L2-3, what appears to be a disc, even I note at this point is calcified at L3-4 eccentric to the left side.  This is essentially the exact pain picture that we had in 2012.  Sharon Calderon probably would do well with a lumbar decompression.  I do not believe she needs to be fused.  I do not know that going into the disc space at any of these levels would be reduced, but that is something to be determined at the time of surgery.  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  She wants to see if she can hold on.  She is getting her pain medications from her primary care physician and she certainly does not abuse those by her telling. Allergies  Allergen Reactions  . Sulfa Antibiotics Other (See Comments)    "It's been so long ago, I can't remember" what the reaction to sulfa was.   Past Medical History  Diagnosis Date  . Hemorrhoids 02/18/2008  . Stroke     TIA- right arm and bottom of right foot numbness.  . Hypertension   . COPD (chronic obstructive pulmonary disease)     Dr Charlyne Quale in [facility] Pulmologist  . Arthritis   . GERD (gastroesophageal reflux disease)   . Hypothyroidism   . Diabetes mellitus without complication   . Depression   . Shortness of breath   . History of kidney stones   . Asthma    Past Surgical History  Procedure Laterality Date  . Abdominal hysterectomy  03/18/2006  . Incontinence surgery    . Cholecystectomy  09/04/08  . Ureteral stent placement    . Cystoscopy     Prior to Admission medications   Medication Sig Start Date End Date Taking? Authorizing Provider  aspirin EC 81 MG tablet Take 81 mg by mouth daily.    Yes Historical Provider, MD  atorvastatin (LIPITOR) 40 MG tablet Take 40 mg by mouth daily.   Yes Historical Provider, MD  calcium carbonate (OS-CAL) 600 MG TABS tablet Take 600 mg by mouth daily with breakfast.   Yes Historical Provider, MD  divalproex (DEPAKOTE ER) 500 MG 24 hr tablet Take 1,000 mg by mouth daily.   Yes Historical Provider, MD  glipiZIDE (GLUCOTROL) 5 MG tablet Take 5 mg by mouth 2 (two) times daily before a meal.   Yes Historical Provider, MD  levothyroxine (SYNTHROID, LEVOTHROID) 50 MCG tablet Take 50 mcg by mouth daily before breakfast.   Yes Historical Provider, MD  lisinopril (PRINIVIL,ZESTRIL) 5 MG tablet Take 5 mg by mouth 2 (two) times daily.   Yes Historical Provider, MD  loratadine (CLARITIN) 10 MG tablet Take 10 mg by mouth daily.   Yes Historical Provider, MD  Magnesium Hydroxide (MAGNESIA PO) Take 1 tablet by mouth daily.   Yes Historical Provider, MD  mesalamine (LIALDA) 1.2 G EC tablet Take 1.2 g by mouth 2 (two) times daily.   Yes Historical Provider, MD  metFORMIN (GLUCOPHAGE) 500 MG tablet Take 1,000 mg by mouth 2 (two) times daily with a meal.   Yes Historical Provider, MD  metoprolol tartrate (LOPRESSOR) 25 MG tablet Take 25 mg by mouth 2 (two) times daily.   Yes Historical Provider, MD  mirabegron ER (MYRBETRIQ) 50 MG TB24 tablet Take 50 mg by mouth daily.   Yes Historical Provider, MD  Multiple Vitamins-Minerals (MULTIVITAMIN WITH MINERALS) tablet Take 1 tablet by mouth daily.   Yes Historical Provider, MD  omeprazole (PRILOSEC) 40 MG capsule Take 40 mg by mouth daily.   Yes Historical Provider, MD  oxyCODONE-acetaminophen (PERCOCET/ROXICET) 5-325 MG per tablet Take 1 tablet by mouth every 4 (four) hours as needed for severe pain.   Yes Historical Provider, MD  Phentermine HCl 37.5 MG TBDP Take 1 tablet by mouth daily.   Yes Historical Provider, MD  temazepam (RESTORIL) 30 MG capsule Take 30 mg by mouth at bedtime as needed for sleep.   Yes Historical  Provider, MD  Vilazodone HCl (VIIBRYD) 40 MG TABS Take 40 mg by mouth daily.   Yes Historical Provider, MD   History reviewed. No pertinent family history. History   Social History  . Marital Status: Single    Spouse Name: N/A    Number of Children: N/A  . Years of Education: N/A   Occupational History  . Not on file.   Social History Main Topics  . Smoking status: Current Every Day Smoker -- 1.00 packs/day for 20 years  . Smokeless tobacco: Not on file  . Alcohol Use: No  . Drug Use: No  . Sexual Activity: Not on file   Other Topics Concern  . Not on file   Social History Narrative  . No narrative on file        PHYSICAL EXAMINATION:   Physical Exam  Constitutional: She is oriented to person, place, and time. She appears well-developed and well-nourished.  HENT:  Head: Normocephalic and atraumatic.  Eyes: Conjunctivae and EOM are normal. Pupils are equal, round, and reactive to light.  Neck: Normal range of motion. Neck supple.  Cardiovascular: Normal rate, regular rhythm and normal heart sounds.   Abdominal: Soft. Bowel sounds are normal.  Neurological: She is alert and oriented to person, place, and time. She has normal reflexes. She displays normal reflexes. No cranial nerve deficit. She exhibits normal muscle tone. Coordination normal.  Skin: Skin is warm and dry.   Risks and benefits were explained to Sharon Calderon. I showed her a model. She understands and wishes to proceed. We will go ahead and schedule the L1-2, L2-3, L3-4 bilateral lumbar decompression. We will schedule this for mid to late July.

## 2014-03-04 NOTE — OR Nursing (Signed)
Ms. Bodenheimers SBP 80/45 in left arm.  Confirmed in right arm 76/49.  Dr. Katrinka BlazingSmith notified and IVF bolus ordered and ephederine 10mg  if needed after the bolus.

## 2014-03-04 NOTE — Anesthesia Procedure Notes (Signed)
Procedure Name: Intubation Date/Time: 03/04/2014 1:32 PM Performed by: Susa Loffler Pre-anesthesia Checklist: Patient identified, Timeout performed, Suction available, Emergency Drugs available and Patient being monitored Patient Re-evaluated:Patient Re-evaluated prior to inductionOxygen Delivery Method: Circle system utilized Preoxygenation: Pre-oxygenation with 100% oxygen Intubation Type: IV induction Ventilation: Mask ventilation without difficulty Laryngoscope Size: Mac and 3 Grade View: Grade I Tube type: Oral Tube size: 7.5 mm Number of attempts: 1 Airway Equipment and Method: Stylet and LTA kit utilized Placement Confirmation: ETT inserted through vocal cords under direct vision,  positive ETCO2 and breath sounds checked- equal and bilateral Secured at: 21 cm Tube secured with: Tape Dental Injury: Teeth and Oropharynx as per pre-operative assessment

## 2014-03-04 NOTE — Op Note (Signed)
03/04/2014  4:53 PM  PATIENT:  Sharon Calderon  53 y.o. female with signs and symptoms consistent with neurogenic claudication, and lumbar radiculopathy. She has significant narrowing due to calcified disc herniations at L1/2,2/3, and 3/4. I have recommended and she has agreed to lumbar laminectomy for decompression.   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 female Whom was taken to the operating room intubated, and placed under a general anesthetic. She was positioned prone on a Wilson frame with all pressure points properly padded. Her back was prepped and draped in a sterile manner. I infiltrated 10cc lidocaine into the lumbar region along the planned incision. I opened the skin with a 10 blade and took the incision down to the thoracolumbar fascia. I exposed the lamina of L1,2,3, and 4 bilaterally, I confirmed my location with an intraoperative xray.  I then decompressed the spinal canal and the L2,3, and 4 nerve roots via an L2, and L3 laminectomy, and hemilaminectomies of L1, and L4. I used the drill and Kerrison punches to remove the bone and to then follow the nerves out of the foramina bilaterally. With Dr. Val RilesNundkumar's assistance we removed ligamentum flavum to decompress the canal. I irrigated then closed the wound. We closed with vicryl sutures approximating the thoracolumbar fascia, subcutaneous, and  subcuticular layers. I used Dermabond for a sterile dressing. She tolerated the procedure without difficulty.   PLAN OF CARE: Admit for overnight observation  PATIENT DISPOSITION:  PACU - hemodynamically stable.   Delay start of Pharmacological VTE agent (>24hrs) due to surgical blood loss or risk of bleeding:  yes

## 2014-03-04 NOTE — Transfer of Care (Signed)
Immediate Anesthesia Transfer of Care Note  Patient: Sharon Calderon  Procedure(s) Performed: Procedure(s): LUMBAR LAMINECTOMY/DECOMPRESSION MICRODISCECTOMY 3 LEVELS lumbar one/two, two/three, three/ four (Bilateral)  Patient Location: PACU  Anesthesia Type:General  Level of Consciousness: awake, alert  and oriented  Airway & Oxygen Therapy: Patient Spontanous Breathing and Patient connected to nasal cannula oxygen  Post-op Assessment: Report given to PACU RN and Post -op Vital signs reviewed and stable  Post vital signs: Reviewed and stable  Complications: No apparent anesthesia complications

## 2014-03-04 NOTE — Anesthesia Preprocedure Evaluation (Signed)
Anesthesia Evaluation  Patient identified by MRN, date of birth, ID band Patient awake    Reviewed: Allergy & Precautions, H&P , NPO status , Patient's Chart, lab work & pertinent test results  Airway       Dental   Pulmonary asthma , COPDCurrent Smoker,          Cardiovascular hypertension,     Neuro/Psych    GI/Hepatic GERD-  ,  Endo/Other  diabetes, Type 2, Oral Hypoglycemic AgentsHypothyroidism Morbid obesity  Renal/GU Renal disease     Musculoskeletal   Abdominal   Peds  Hematology   Anesthesia Other Findings   Reproductive/Obstetrics                           Anesthesia Physical Anesthesia Plan  ASA: III  Anesthesia Plan: General   Post-op Pain Management:    Induction: Intravenous  Airway Management Planned: Oral ETT  Additional Equipment:   Intra-op Plan:   Post-operative Plan: Extubation in OR  Informed Consent: I have reviewed the patients History and Physical, chart, labs and discussed the procedure including the risks, benefits and alternatives for the proposed anesthesia with the patient or authorized representative who has indicated his/her understanding and acceptance.     Plan Discussed with: CRNA, Anesthesiologist and Surgeon  Anesthesia Plan Comments:         Anesthesia Quick Evaluation

## 2014-03-05 DIAGNOSIS — M5126 Other intervertebral disc displacement, lumbar region: Secondary | ICD-10-CM | POA: Diagnosis not present

## 2014-03-05 LAB — GLUCOSE, CAPILLARY: GLUCOSE-CAPILLARY: 191 mg/dL — AB (ref 70–99)

## 2014-03-05 MED ORDER — TIZANIDINE HCL 4 MG PO TABS: 4.0000 mg | ORAL_TABLET | Freq: Four times a day (QID) | ORAL | Status: AC | PRN

## 2014-03-05 MED ORDER — OXYCODONE-ACETAMINOPHEN 5-325 MG PO TABS: 1.0000 | ORAL_TABLET | Freq: Four times a day (QID) | ORAL | Status: DC | PRN

## 2014-03-05 NOTE — Progress Notes (Signed)
Patient alert and oriented, mae's well, voiding adequate amount of urine, swallowing without difficulty, no c/o pain. Patient discharged home with family. Script and discharged instructions given to patient. Patient and family stated understanding of instructions given.  

## 2014-03-05 NOTE — Discharge Summary (Signed)
  BP 96/63  Pulse 97  Temp(Src) 97.8 F (36.6 C) (Oral)  Resp 18  Ht 4\' 11"  (1.499 m)  Wt 89.585 kg (197 lb 8 oz)  BMI 39.87 kg/m2  SpO2 97% 03/04/2014 Admitting DX: Lumbar stenosis L1-4, Lumbar spondylosis L1-4 Discharge ZO:XWRUx:Same Surgeon:Josselin Gaulin Procedure:Lumbar laminectomy L2,3, hemilaminectomies L1,2 Status:Alive and well Discharge dest:Home Physical Exam:Alert and oriented x 4, speech is clear and fluent, moving all extremities well. Voiding, tolerating a regular diet, wound is clean, dry, and without signs of infection Discharge Medications:percocet, tizanidine

## 2014-03-05 NOTE — Discharge Instructions (Signed)
Wound Care °Leave incision open to air. °You may shower. °Do not scrub directly on incision.  °Do not put any creams, lotions, or ointments on incision. °Activity °Walk each and every day, increasing distance each day. °No lifting greater than 5 lbs.  Avoid bending, arching, and twisting. °No driving for 2 weeks; may ride as a passenger locally. ° °Diet °Resume your normal diet.  °Return to Work °Will be discussed at you follow up appointment. °Call Your Doctor If Any of These Occur °Redness, drainage, or swelling at the wound.  °Temperature greater than 101 degrees. °Severe pain not relieved by pain medication. °Incision starts to come apart. °Follow Up Appt °Call today for appointment in 4 weeks ([phone]) or for problems.  If you have any hardware placed in your spine, you will need an x-ray before your appointment. °

## 2014-03-07 NOTE — Anesthesia Postprocedure Evaluation (Signed)
  Anesthesia Post-op Note  Patient: Temple A Arista  Procedure(s) Performed: Procedure(s): LUMBAR LAMINECTOMY/DECOMPRESSION MICRODISCECTOMY 3 LEVELS lumbar one/two, two/three, three/ four (Bilateral)  Patient Location: PACU  Anesthesia Type:General  Level of Consciousness: awake, alert , oriented and patient cooperative  Airway and Oxygen Therapy: Patient Spontanous Breathing  Post-op Pain: mild  Post-op Assessment: Post-op Vital signs reviewed, Patient's Cardiovascular Status Stable, Respiratory Function Stable, Patent Airway and No signs of Nausea or vomiting  Post-op Vital Signs: stable  Last Vitals:  Filed Vitals:   03/05/14 0751  BP: 96/63  Pulse: 97  Temp: 36.6 C  Resp: 18    Complications: No apparent anesthesia complications

## 2014-03-09 ENCOUNTER — Encounter (HOSPITAL_COMMUNITY): Payer: Self-pay | Admitting: Neurosurgery

## 2014-06-20 DIAGNOSIS — M1712 Unilateral primary osteoarthritis, left knee: Secondary | ICD-10-CM | POA: Insufficient documentation

## 2015-10-16 ENCOUNTER — Emergency Department (HOSPITAL_BASED_OUTPATIENT_CLINIC_OR_DEPARTMENT_OTHER)
Admission: EM | Admit: 2015-10-16 | Discharge: 2015-10-16 | Disposition: A | Discharge status: Home / Self Care | Payer: Medicare Other | Attending: Emergency Medicine | Admitting: Emergency Medicine

## 2015-10-16 ENCOUNTER — Encounter (HOSPITAL_BASED_OUTPATIENT_CLINIC_OR_DEPARTMENT_OTHER): Payer: Self-pay

## 2015-10-16 DIAGNOSIS — Y998 Other external cause status: Secondary | ICD-10-CM | POA: Insufficient documentation

## 2015-10-16 DIAGNOSIS — S0101XA Laceration without foreign body of scalp, initial encounter: Secondary | ICD-10-CM | POA: Insufficient documentation

## 2015-10-16 DIAGNOSIS — I10 Essential (primary) hypertension: Secondary | ICD-10-CM | POA: Insufficient documentation

## 2015-10-16 DIAGNOSIS — K219 Gastro-esophageal reflux disease without esophagitis: Secondary | ICD-10-CM | POA: Insufficient documentation

## 2015-10-16 DIAGNOSIS — F17200 Nicotine dependence, unspecified, uncomplicated: Secondary | ICD-10-CM | POA: Insufficient documentation

## 2015-10-16 DIAGNOSIS — Y9389 Activity, other specified: Secondary | ICD-10-CM | POA: Diagnosis not present

## 2015-10-16 DIAGNOSIS — M1990 Unspecified osteoarthritis, unspecified site: Secondary | ICD-10-CM | POA: Diagnosis not present

## 2015-10-16 DIAGNOSIS — Z7982 Long term (current) use of aspirin: Secondary | ICD-10-CM | POA: Insufficient documentation

## 2015-10-16 DIAGNOSIS — W228XXA Striking against or struck by other objects, initial encounter: Secondary | ICD-10-CM | POA: Insufficient documentation

## 2015-10-16 DIAGNOSIS — Z8673 Personal history of transient ischemic attack (TIA), and cerebral infarction without residual deficits: Secondary | ICD-10-CM | POA: Diagnosis not present

## 2015-10-16 DIAGNOSIS — J449 Chronic obstructive pulmonary disease, unspecified: Secondary | ICD-10-CM | POA: Insufficient documentation

## 2015-10-16 DIAGNOSIS — S0990XA Unspecified injury of head, initial encounter: Secondary | ICD-10-CM | POA: Diagnosis present

## 2015-10-16 DIAGNOSIS — F329 Major depressive disorder, single episode, unspecified: Secondary | ICD-10-CM | POA: Insufficient documentation

## 2015-10-16 DIAGNOSIS — E119 Type 2 diabetes mellitus without complications: Secondary | ICD-10-CM | POA: Insufficient documentation

## 2015-10-16 DIAGNOSIS — Z23 Encounter for immunization: Secondary | ICD-10-CM | POA: Insufficient documentation

## 2015-10-16 DIAGNOSIS — Z79899 Other long term (current) drug therapy: Secondary | ICD-10-CM | POA: Diagnosis not present

## 2015-10-16 DIAGNOSIS — Y9289 Other specified places as the place of occurrence of the external cause: Secondary | ICD-10-CM | POA: Insufficient documentation

## 2015-10-16 DIAGNOSIS — Z87442 Personal history of urinary calculi: Secondary | ICD-10-CM | POA: Insufficient documentation

## 2015-10-16 DIAGNOSIS — Z7984 Long term (current) use of oral hypoglycemic drugs: Secondary | ICD-10-CM | POA: Insufficient documentation

## 2015-10-16 DIAGNOSIS — E039 Hypothyroidism, unspecified: Secondary | ICD-10-CM | POA: Insufficient documentation

## 2015-10-16 MED ORDER — LIDOCAINE-EPINEPHRINE 2 %-1:100000 IJ SOLN
INTRAMUSCULAR | Status: AC
  Filled 2015-10-16: qty 1

## 2015-10-16 MED ORDER — TETANUS-DIPHTHERIA TOXOIDS TD 5-2 LFU IM INJ
0.5000 mL | INJECTION | Freq: Once | INTRAMUSCULAR | Status: AC
  Administered 2015-10-16: 0.5 mL via INTRAMUSCULAR
  Filled 2015-10-16: qty 0.5

## 2015-10-16 NOTE — Discharge Instructions (Signed)
Head Injury, Adult °You have a head injury. Headaches and throwing up (vomiting) are common after a head injury. It should be easy to wake up from sleeping. Sometimes you must stay in the hospital. Most problems happen within the first 24 hours. Side effects may occur up to 7-10 days after the injury.  °WHAT ARE THE TYPES OF HEAD INJURIES? °Head injuries can be as minor as a bump. Some head injuries can be more severe. More severe head injuries include: °· A jarring injury to the brain (concussion). °· A bruise of the brain (contusion). This mean there is bleeding in the brain that can cause swelling. °· A cracked skull (skull fracture). °· Bleeding in the brain that collects, clots, and forms a bump (hematoma). °WHEN SHOULD I GET HELP RIGHT AWAY?  °· You are confused or sleepy. °· You cannot be woken up. °· You feel sick to your stomach (nauseous) or keep throwing up (vomiting). °· Your dizziness or unsteadiness is getting worse. °· You have very bad, lasting headaches that are not helped by medicine. Take medicines only as told by your doctor. °· You cannot use your arms or legs like normal. °· You cannot walk. °· You notice changes in the black spots in the center of the colored part of your eye (pupil). °· You have clear or bloody fluid coming from your nose or ears. °· You have trouble seeing. °During the next 24 hours after the injury, you must stay with someone who can watch you. This person should get help right away (call 911 in the [place] if you start to shake and are not able to control it (have seizures), you pass out, or you are unable to wake up. °HOW CAN I PREVENT A HEAD INJURY IN THE FUTURE? °· Wear seat belts. °· Wear a helmet while bike riding and playing sports like football. °· Stay away from dangerous activities around the house. °WHEN CAN I RETURN TO NORMAL ACTIVITIES AND ATHLETICS? °See your doctor before doing these activities. You should not do normal activities or play contact sports until 1  week after the following symptoms have stopped: °· Headache that does not go away. °· Dizziness. °· Poor attention. °· Confusion. °· Memory problems. °· Sickness to your stomach or throwing up. °· Tiredness. °· Fussiness. °· Bothered by bright lights or loud noises. °· Anxiousness or depression. °· Restless sleep. °MAKE SURE YOU:  °· Understand these instructions. °· Will watch your condition. °· Will get help right away if you are not doing well or get worse. °  °This information is not intended to replace advice given to you by your health care provider. Make sure you discuss any questions you have with your health care provider. °  °Document Released: 07/11/2008 Document Revised: 08/19/2014 Document Reviewed: 04/05/2013 °Elsevier Interactive Patient Education ©2016 [employer] ° °Laceration Care, Adult °A laceration is a cut that goes through all layers of the skin. The cut also goes into the tissue that is right under the skin. Some cuts heal on their own. Others need to be closed with stitches (sutures), staples, skin adhesive strips, or wound glue. Taking care of your cut lowers your risk of infection and helps your cut to heal better. °HOW TO TAKE CARE OF YOUR CUT °For stitches or staples: °· Keep the wound clean and dry. °· If you were given a bandage (dressing), you should change it at least one time per day or as told by your doctor. You should also   change it if it gets wet or dirty. °· Keep the wound completely dry for the first 24 hours or as told by your doctor. After that time, you may take a shower or a bath. However, make sure that the wound is not soaked in water until after the stitches or staples have been removed. °· Clean the wound one time each day or as told by your doctor: °¨ Wash the wound with soap and water. °¨ Rinse the wound with water until all of the soap comes off. °¨ Pat the wound dry with a clean towel. Do not rub the wound. °· After you clean the wound, put a thin layer of  antibiotic ointment on it as told by your doctor. This ointment: °¨ Helps to prevent infection. °¨ Keeps the bandage from sticking to the wound. °· Have your stitches or staples removed as told by your doctor. °If your doctor used skin adhesive strips:  °· Keep the wound clean and dry. °· If you were given a bandage, you should change it at least one time per day or as told by your doctor. You should also change it if it gets dirty or wet. °· Do not get the skin adhesive strips wet. You can take a shower or a bath, but be careful to keep the wound dry. °· If the wound gets wet, pat it dry with a clean towel. Do not rub the wound. °· Skin adhesive strips fall off on their own. You can trim the strips as the wound heals. Do not remove any strips that are still stuck to the wound. They will fall off after a while. °If your doctor used wound glue: °· Try to keep your wound dry, but you may briefly wet it in the shower or bath. Do not soak the wound in water, such as by swimming. °· After you take a shower or a bath, gently pat the wound dry with a clean towel. Do not rub the wound. °· Do not do any activities that will make you really sweaty until the skin glue has fallen off on its own. °· Do not apply liquid, cream, or ointment medicine to your wound while the skin glue is still on. °· If you were given a bandage, you should change it at least one time per day or as told by your doctor. You should also change it if it gets dirty or wet. °· If a bandage is placed over the wound, do not let the tape for the bandage touch the skin glue. °· Do not pick at the glue. The skin glue usually stays on for 5-10 days. Then, it falls off of the skin. °General Instructions  °· To help prevent scarring, make sure to cover your wound with sunscreen whenever you are outside after stitches are removed, after adhesive strips are removed, or when wound glue stays in place and the wound is healed. Make sure to wear a sunscreen of at least  30 SPF. °· Take over-the-counter and prescription medicines only as told by your doctor. °· If you were given antibiotic medicine or ointment, take or apply it as told by your doctor. Do not stop using the antibiotic even if your wound is getting better. °· Do not scratch or pick at the wound. °· Keep all follow-up visits as told by your doctor. This is important. °· Check your wound every day for signs of infection. Watch for: °¨ Redness, swelling, or pain. °¨ Fluid, blood, or pus. °·   Raise (elevate) the injured area above the level of your heart while you are sitting or lying down, if possible. °GET HELP IF: °· You got a tetanus shot and you have any of these problems at the injection site: °¨ Swelling. °¨ Very bad pain. °¨ Redness. °¨ Bleeding. °· You have a fever. °· A wound that was closed breaks open. °· You notice a bad smell coming from your wound or your bandage. °· You notice something coming out of the wound, such as wood or glass. °· Medicine does not help your pain. °· You have more redness, swelling, or pain at the site of your wound. °· You have fluid, blood, or pus coming from your wound. °· You notice a change in the color of your skin near your wound. °· You need to change the bandage often because fluid, blood, or pus is coming from the wound. °· You start to have a new rash. °· You start to have numbness around the wound. °GET HELP RIGHT AWAY IF: °· You have very bad swelling around the wound. °· Your pain suddenly gets worse and is very bad. °· You notice painful lumps near the wound or on skin that is anywhere on your body. °· You have a red streak going away from your wound. °· The wound is on your hand or foot and you cannot move a finger or toe like you usually can. °· The wound is on your hand or foot and you notice that your fingers or toes look pale or bluish. °  °This information is not intended to replace advice given to you by your health care provider. Make sure you discuss any  questions you have with your health care provider. °  °Document Released: 01/15/2008 Document Revised: 12/13/2014 Document Reviewed: 07/25/2014 °Elsevier Interactive Patient Education ©2016 [employer]. ° °
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
## 2015-10-16 NOTE — ED Notes (Signed)
Pt given d/c instructions as per chart. Verbalizes understanding. No questions. 

## 2015-10-23 ENCOUNTER — Emergency Department (HOSPITAL_BASED_OUTPATIENT_CLINIC_OR_DEPARTMENT_OTHER)
Admission: EM | Admit: 2015-10-23 | Discharge: 2015-10-23 | Disposition: A | Discharge status: Home / Self Care | Payer: Medicare Other | Attending: Emergency Medicine | Admitting: Emergency Medicine

## 2015-10-23 ENCOUNTER — Encounter (HOSPITAL_BASED_OUTPATIENT_CLINIC_OR_DEPARTMENT_OTHER): Payer: Self-pay | Admitting: *Deleted

## 2015-10-23 DIAGNOSIS — Z4802 Encounter for removal of sutures: Secondary | ICD-10-CM | POA: Diagnosis not present

## 2015-10-23 DIAGNOSIS — Z8673 Personal history of transient ischemic attack (TIA), and cerebral infarction without residual deficits: Secondary | ICD-10-CM | POA: Insufficient documentation

## 2015-10-23 DIAGNOSIS — F17200 Nicotine dependence, unspecified, uncomplicated: Secondary | ICD-10-CM | POA: Diagnosis not present

## 2015-10-23 DIAGNOSIS — Z7984 Long term (current) use of oral hypoglycemic drugs: Secondary | ICD-10-CM | POA: Insufficient documentation

## 2015-10-23 DIAGNOSIS — Z7982 Long term (current) use of aspirin: Secondary | ICD-10-CM | POA: Diagnosis not present

## 2015-10-23 DIAGNOSIS — E119 Type 2 diabetes mellitus without complications: Secondary | ICD-10-CM | POA: Insufficient documentation

## 2015-10-23 DIAGNOSIS — J449 Chronic obstructive pulmonary disease, unspecified: Secondary | ICD-10-CM | POA: Insufficient documentation

## 2015-10-23 DIAGNOSIS — Z79899 Other long term (current) drug therapy: Secondary | ICD-10-CM | POA: Diagnosis not present

## 2015-10-23 DIAGNOSIS — E039 Hypothyroidism, unspecified: Secondary | ICD-10-CM | POA: Diagnosis not present

## 2015-10-23 DIAGNOSIS — K219 Gastro-esophageal reflux disease without esophagitis: Secondary | ICD-10-CM | POA: Diagnosis not present

## 2015-10-23 DIAGNOSIS — M1990 Unspecified osteoarthritis, unspecified site: Secondary | ICD-10-CM | POA: Insufficient documentation

## 2015-10-23 DIAGNOSIS — I10 Essential (primary) hypertension: Secondary | ICD-10-CM | POA: Diagnosis not present

## 2015-10-23 DIAGNOSIS — F329 Major depressive disorder, single episode, unspecified: Secondary | ICD-10-CM | POA: Insufficient documentation

## 2015-10-23 DIAGNOSIS — Z87442 Personal history of urinary calculi: Secondary | ICD-10-CM | POA: Insufficient documentation

## 2015-10-23 NOTE — Discharge Instructions (Signed)

## 2015-10-23 NOTE — ED Notes (Signed)
Here for staple removal

## 2015-10-24 NOTE — ED Provider Notes (Signed)
CSN: [id number]     Arrival date & time 10/23/15  1115 History   First MD Initiated Contact with Patient 10/23/15 1204     Chief Complaint  Patient presents with  . Suture / Staple Removal     (Consider location/radiation/quality/duration/timing/severity/associated sxs/prior Treatment) HPI Comments: Patient presents for staple removal. She apparently fell and struck her head one week ago causing a laceration to her scalp. This was repaired with staples. She is doing well with no problems.  Patient is a 55 y.o. female presenting with suture removal. The history is provided by the patient.  Suture / Staple Removal This is a new problem. Episode onset: 1 week ago. The problem occurs constantly. The problem has not changed since onset.Nothing aggravates the symptoms. Nothing relieves the symptoms.    Past Medical History  Diagnosis Date  . Hemorrhoids 02/18/2008  . Stroke [facility])     TIA- right arm and bottom of right foot numbness.  . Hypertension   . COPD (chronic obstructive pulmonary disease) (HCC)     Dr Charlyne Quale in [facility] Pulmologist  . Arthritis   . GERD (gastroesophageal reflux disease)   . Hypothyroidism   . Diabetes mellitus without complication (HCC)   . Depression   . Shortness of breath   . History of kidney stones   . Asthma    Past Surgical History  Procedure Laterality Date  . Abdominal hysterectomy  03/18/2006  . Incontinence surgery    . Cholecystectomy  09/04/08  . Ureteral stent placement    . Cystoscopy    . Lumbar laminectomy/decompression microdiscectomy Bilateral 03/04/2014    Procedure: LUMBAR LAMINECTOMY/DECOMPRESSION MICRODISCECTOMY 3 LEVELS lumbar one/two, two/three, three/ four;  Surgeon: Carmela Hurt, MD;  Location: [facility];  Service: Neurosurgery;  Laterality: Bilateral;   No family history on file. Social History  Substance Use Topics  . Smoking status: Current Every Day Smoker -- 1.00 packs/day for 20 years  . Smokeless tobacco: None  .  Alcohol Use: No   OB History    No data available     Review of Systems  All other systems reviewed and are negative.     Allergies  Sulfa antibiotics  Home Medications   Prior to Admission medications   Medication Sig Start Date End Date Taking? Authorizing Provider  aspirin EC 81 MG tablet Take 81 mg by mouth daily.    Historical Provider, MD  atorvastatin (LIPITOR) 40 MG tablet Take 40 mg by mouth daily.    Historical Provider, MD  calcium carbonate (OS-CAL) 600 MG TABS tablet Take 600 mg by mouth daily with breakfast.    Historical Provider, MD  divalproex (DEPAKOTE ER) 500 MG 24 hr tablet Take 1,000 mg by mouth daily.    Historical Provider, MD  glipiZIDE (GLUCOTROL) 5 MG tablet Take 5 mg by mouth 2 (two) times daily before a meal.    Historical Provider, MD  levothyroxine (SYNTHROID, LEVOTHROID) 50 MCG tablet Take 50 mcg by mouth daily before breakfast.    Historical Provider, MD  lisinopril (PRINIVIL,ZESTRIL) 5 MG tablet Take 5 mg by mouth 2 (two) times daily.    Historical Provider, MD  loratadine (CLARITIN) 10 MG tablet Take 10 mg by mouth daily.    Historical Provider, MD  Magnesium Hydroxide (MAGNESIA PO) Take 1 tablet by mouth daily.    Historical Provider, MD  mesalamine (LIALDA) 1.2 G EC tablet Take 1.2 g by mouth 2 (two) times daily.    Historical Provider, MD  metFORMIN (GLUCOPHAGE) 500 MG tablet Take 1,000 mg by mouth 2 (two) times daily with a meal.    Historical Provider, MD  metoprolol tartrate (LOPRESSOR) 25 MG tablet Take 25 mg by mouth 2 (two) times daily.    Historical Provider, MD  mirabegron ER (MYRBETRIQ) 50 MG TB24 tablet Take 50 mg by mouth daily.    Historical Provider, MD  Multiple Vitamins-Minerals (MULTIVITAMIN WITH MINERALS) tablet Take 1 tablet by mouth daily.    Historical Provider, MD  omeprazole (PRILOSEC) 40 MG capsule Take 40 mg by mouth daily.    Historical Provider, MD  oxyCODONE-acetaminophen (PERCOCET/ROXICET) 5-325 MG per tablet Take 1  tablet by mouth every 4 (four) hours as needed for severe pain.    Historical Provider, MD  oxyCODONE-acetaminophen (ROXICET) 5-325 MG per tablet Take 1 tablet by mouth every 6 (six) hours as needed for severe pain. 03/05/14   Coletta MemosKyle Cabbell, MD  Phentermine HCl 37.5 MG TBDP Take 1 tablet by mouth daily.    Historical Provider, MD  temazepam (RESTORIL) 30 MG capsule Take 30 mg by mouth at bedtime as needed for sleep.    Historical Provider, MD  tiZANidine (ZANAFLEX) 4 MG tablet Take 1 tablet (4 mg total) by mouth every 6 (six) hours as needed for muscle spasms. 03/05/14   Coletta MemosKyle Cabbell, MD  Vilazodone HCl (VIIBRYD) 40 MG TABS Take 40 mg by mouth daily.    Historical Provider, MD   BP 103/69 mmHg  Pulse 80  Temp(Src) 97.9 F (36.6 C) (Oral)  Resp 18  Ht 4\' 9"  (1.448 m)  Wt 166 lb (75.297 kg)  BMI 35.91 kg/m2  SpO2 99% Physical Exam  Constitutional: She appears well-developed and well-nourished. No distress.  HENT:  The top of the head is noted a laceration with 7 staples in place. It appears to be well approximated and healing well.  Skin: She is not diaphoretic.  Nursing note and vitals reviewed.   ED Course  Procedures (including critical care time) Labs Review Labs Reviewed - No data to display  Imaging Review No results found. I have personally reviewed and evaluated these images and lab results as part of my medical decision-making.   EKG Interpretation None      MDM   Final diagnoses:  Encounter for staple removal    Staples were removed and the patient tolerated this well. To return as needed for any problems.    Geoffery Lyonsouglas Pihu Basil, MD 10/24/15 [phone]

## 2017-05-08 ENCOUNTER — Ambulatory Visit: Payer: Medicare Other | Admitting: Neurology

## 2017-07-14 ENCOUNTER — Encounter: Payer: Self-pay | Admitting: Diagnostic Neuroimaging

## 2017-07-15 ENCOUNTER — Ambulatory Visit (INDEPENDENT_AMBULATORY_CARE_PROVIDER_SITE_OTHER): Payer: Medicare Other | Admitting: Diagnostic Neuroimaging

## 2017-07-15 ENCOUNTER — Encounter (INDEPENDENT_AMBULATORY_CARE_PROVIDER_SITE_OTHER): Payer: Self-pay

## 2017-07-15 ENCOUNTER — Encounter: Payer: Self-pay | Admitting: Diagnostic Neuroimaging

## 2017-07-15 VITALS — BP 148/82 | HR 74 | Ht 59.0 in | Wt 193.2 lb

## 2017-07-15 DIAGNOSIS — E1142 Type 2 diabetes mellitus with diabetic polyneuropathy: Secondary | ICD-10-CM

## 2017-07-15 DIAGNOSIS — G5710 Meralgia paresthetica, unspecified lower limb: Secondary | ICD-10-CM

## 2017-07-15 DIAGNOSIS — M5416 Radiculopathy, lumbar region: Secondary | ICD-10-CM | POA: Diagnosis not present

## 2017-07-15 NOTE — Progress Notes (Signed)
[facility]  PATIENT: Sharon Calderon DOB: 1961-04-04  REFERRING CLINICIAN: Therisa DoyneA Taylor, PA-c HISTORY FROM: patient and chart review REASON FOR VISIT: new consult    HISTORICAL  CHIEF COMPLAINT:  Chief Complaint  Patient presents with  . Paresthesia of BLE    rm 6, New Pt, "issue with both legs x 2 years; feel like my legs are going to give out under me, when I stand my legs and back starts hurting, I get dizzy and have to sit down"    HISTORY OF PRESENT ILLNESS:   56 year old female with history of diabetes, hypercholesteremia, obesity, tobacco abuse, here for evaluation of lower extremity pain and weakness.  2012 patient had onset of low back pain radiating to the legs.  This gradually worsened over time.  Symptoms worse with standing and walking.  Ultimately she was diagnosed with lumbar spinal stenosis and underwent lumbar decompression surgery in 2015 by Dr. Coletta MemosKyle Cabbell.  Her leg pain symptoms improved after surgery but her low back pain continued.  She continued conservative management with physical therapy and pain medications over the years.  She is now established with pain management specialist Dr. Loletta ParishSpillane.  In the past 6-12 months she has had new onset aching and pain in her bilateral thighs, especially when she stands or walks for a long time.  Her low back pain continues.  She was referred to a neurologist Dr. Maple HudsonMoser, who diagnosed possible meralgia paresthetica, diabetic neuropathy or lumbar radiculopathy.  He advised to increase Lyrica and consider spinal cord stimulator.  Patient had continued questions and concerns about her diagnosis, prognosis and treatment options.  Therefore patient has been referred here for neurologic consultation as well as possible EMG nerve conduction study.  The patient tells me that she is not interested in further lumbar spine surgery.  She has not been back to her neurosurgeon Dr. Franky Machoabbell since onset of new symptoms 6-12  months ago.  Patient tells me that she has been gaining weight.  She has a poor diet.  She continues to smoke.  She is not exercising.   REVIEW OF SYSTEMS: Full 14 system review of systems performed and negative with exception of: Weakness snoring blurred vision shortness of breath cough hearing loss ringing in ears.  ALLERGIES: Allergies  Allergen Reactions  . Sulfa Antibiotics Other (See Comments)    "It's been so long ago, I can't remember" what the reaction to sulfa was.    HOME MEDICATIONS: Outpatient Medications Prior to Visit  Medication Sig Dispense Refill  . aspirin EC 81 MG tablet Take 81 mg by mouth daily.    Marland Kitchen. atorvastatin (LIPITOR) 40 MG tablet Take 40 mg by mouth daily.    . calcium carbonate (OS-CAL) 600 MG TABS tablet Take 600 mg by mouth daily with breakfast.    . divalproex (DEPAKOTE ER) 500 MG 24 hr tablet Take 1,000 mg by mouth daily.    Marland Kitchen. glipiZIDE (GLUCOTROL) 5 MG tablet Take 5 mg by mouth 2 (two) times daily before a meal.    . HYDROcodone-acetaminophen (NORCO) 10-325 MG tablet     . levothyroxine (SYNTHROID, LEVOTHROID) 50 MCG tablet Take 50 mcg by mouth daily before breakfast.    . lisinopril-hydrochlorothiazide (PRINZIDE,ZESTORETIC) 20-12.5 MG tablet daily.    Marland Kitchen. loratadine (CLARITIN) 10 MG tablet Take 10 mg by mouth daily.    Marland Kitchen. LYRICA 100 MG capsule 100 mg 2 (two) times daily.    . mesalamine (LIALDA) 1.2 G EC tablet Take 1.2 g  by mouth 2 (two) times daily.    . metFORMIN (GLUCOPHAGE) 500 MG tablet Take 1,000 mg by mouth 2 (two) times daily with a meal.    . metoprolol tartrate (LOPRESSOR) 25 MG tablet Take 25 mg by mouth 2 (two) times daily.    . mirabegron ER (MYRBETRIQ) 50 MG TB24 tablet Take 50 mg by mouth daily.    . Multiple Vitamins-Minerals (MULTIVITAMIN WITH MINERALS) tablet Take 1 tablet by mouth daily.    Marland Kitchen omeprazole (PRILOSEC) 40 MG capsule Take 40 mg by mouth daily.    . Phentermine HCl 37.5 MG TBDP Take 1 tablet by mouth daily.    . temazepam  (RESTORIL) 30 MG capsule Take 30 mg by mouth at bedtime as needed for sleep.    Marland Kitchen tiZANidine (ZANAFLEX) 4 MG tablet Take 1 tablet (4 mg total) by mouth every 6 (six) hours as needed for muscle spasms. 60 tablet 0  . Vilazodone HCl (VIIBRYD) 40 MG TABS Take 40 mg by mouth daily.    Marland Kitchen zolpidem (AMBIEN) 10 MG tablet     . lisinopril (PRINIVIL,ZESTRIL) 5 MG tablet Take 5 mg by mouth 2 (two) times daily.    Marland Kitchen oxyCODONE-acetaminophen (PERCOCET/ROXICET) 5-325 MG per tablet Take 1 tablet by mouth every 4 (four) hours as needed for severe pain.    Marland Kitchen oxyCODONE-acetaminophen (ROXICET) 5-325 MG per tablet Take 1 tablet by mouth every 6 (six) hours as needed for severe pain. 80 tablet 0  . Magnesium Hydroxide (MAGNESIA PO) Take 1 tablet by mouth daily.     No facility-administered medications prior to visit.     PAST MEDICAL HISTORY: Past Medical History:  Diagnosis Date  . Arthritis   . Asthma   . CAD (coronary artery disease)   . Carpal tunnel syndrome    bilateral  . CD (celiac disease)   . COPD (chronic obstructive pulmonary disease) (HCC)    Dr Charlyne Quale in [facility] Pulmologist  . DDD (degenerative disc disease), lumbar   . Depression   . Diabetes mellitus without complication (HCC)   . GERD (gastroesophageal reflux disease)   . GERD (gastroesophageal reflux disease)   . Hemorrhoids 02/18/2008  . History of kidney stones   . Hypercholesterolemia   . Hypertension   . Hypothyroidism   . OSA (obstructive sleep apnea)   . Paresthesia   . PVD (peripheral vascular disease) (HCC)   . Shingles   . Shortness of breath   . Stroke [facility])    TIA- right arm and bottom of right foot numbness.    PAST SURGICAL HISTORY: Past Surgical History:  Procedure Laterality Date  . ABDOMINAL HYSTERECTOMY  03/18/2006  . CHOLECYSTECTOMY  09/04/08  . CYSTOSCOPY    . INCONTINENCE SURGERY    . LUMBAR LAMINECTOMY/DECOMPRESSION MICRODISCECTOMY Bilateral 03/04/2014   Procedure: LUMBAR LAMINECTOMY/DECOMPRESSION  MICRODISCECTOMY 3 LEVELS lumbar one/two, two/three, three/ four;  Surgeon: Carmela Hurt, MD;  Location: [facility];  Service: Neurosurgery;  Laterality: Bilateral;  . URETERAL STENT PLACEMENT      FAMILY HISTORY: Family History  Problem Relation Age of Onset  . Hypertension Mother   . Heart disease Father     SOCIAL HISTORY:  Social History   Socioeconomic History  . Marital status: Single    Spouse name: Not on file  . Number of children: Not on file  . Years of education: Not on file  . Highest education level: Not on file  Social Needs  . Financial resource strain: Not on file  .  Food insecurity - worry: Not on file  . Food insecurity - inability: Not on file  . Transportation needs - medical: Not on file  . Transportation needs - non-medical: Not on file  Occupational History  . Not on file  Tobacco Use  . Smoking status: Current Every Day Smoker    Packs/day: 1.00    Years: 20.00    Pack years: 20.00  . Smokeless tobacco: Never Used  Substance and Sexual Activity  . Alcohol use: No  . Drug use: No  . Sexual activity: Not on file  Other Topics Concern  . Not on file  Social History Narrative   Lives with daughter   2 children   Education: 7 th grade   Home maker     PHYSICAL EXAM  GENERAL EXAM/CONSTITUTIONAL: Vitals:  Vitals:   07/15/17 1051  BP: (!) 148/82  Pulse: 74  Weight: 193 lb 3.2 oz (87.6 kg)  Height: 4\' 11"  (1.499 m)     Body mass index is 39.02 kg/m.  Visual Acuity Screening   Right eye Left eye Both eyes  Without correction:     With correction: 20/50 20/70      Patient is in no distress; well developed, nourished and groomed; neck is supple  SMELLS OF CIG SMOKE  CARDIOVASCULAR:  Examination of carotid arteries is normal; no carotid bruits  Regular rate and rhythm, no murmurs  Examination of peripheral vascular system by observation and palpation is normal  EYES:  Ophthalmoscopic exam of optic discs and posterior  segments is normal; no papilledema or hemorrhages  MUSCULOSKELETAL:  Gait, strength, tone, movements noted in Neurologic exam below  NEUROLOGIC: MENTAL STATUS:  No flowsheet data found.  awake, alert, oriented to person, place and time  recent and remote memory intact  normal attention and concentration  language fluent, comprehension intact, naming intact,   fund of knowledge appropriate  CRANIAL NERVE:   2nd - no papilledema on fundoscopic exam  2nd, 3rd, 4th, 6th - pupils equal and reactive to light, visual fields full to confrontation, extraocular muscles intact, no nystagmus  5th - facial sensation symmetric  7th - facial strength symmetric  8th - hearing intact  9th - palate elevates symmetrically, uvula midline  11th - shoulder shrug symmetric  12th - tongue protrusion midline  MOTOR:   normal bulk and tone, full strength in the BUE, BLE  SENSORY:   normal and symmetric to light touch, temperature, vibration  COORDINATION:   finger-nose-finger, fine finger movements normal  REFLEXES:   deep tendon reflexes TRACE and symmetric  GAIT/STATION:   narrow based gait; ANTALGIC GAIT    DIAGNOSTIC DATA (LABS, IMAGING, TESTING) - I reviewed patient records, labs, notes, testing and imaging myself where available.  Lab Results  Component Value Date   WBC 11.3 (H) 02/24/2014   HGB 13.1 02/24/2014   HCT 39.7 02/24/2014   MCV 91.9 02/24/2014   PLT 236 02/24/2014      Component Value Date/Time   NA 140 02/24/2014 1454   K 4.2 02/24/2014 1454   CL 96 02/24/2014 1454   CO2 28 02/24/2014 1454   GLUCOSE 297 (H) 02/24/2014 1454   BUN 17 02/24/2014 1454   CREATININE 1.01 02/24/2014 1454   CALCIUM 9.2 02/24/2014 1454   GFRNONAA 62 (L) 02/24/2014 1454   GFRAA 72 (L) 02/24/2014 1454   No results found for: CHOL, HDL, LDLCALC, LDLDIRECT, TRIG, CHOLHDL No results found for: NWGN5AHGBA1C No results found for: VITAMINB12 No results found  for:  TSH   09/21/10 CT myelogram - T12-L1:  Disc degeneration with circumferential osteophytes and protruding disc material.  No apparent compressive stenosis. - L1-2:  Disc degeneration with circumferential osteophytes and protruding disc material.  Marked encroachment upon the spinal canal with effacement of the subarachnoid space and slight dorsal displacement of the conus tip.  Neural compressive symptoms could derive from this level. - L2-3:  Disc degeneration with circumferential osteophytes and protruding disc material.  Stenosis of the lateral recesses that could cause neural compression on either side. - L3-4:  Disc degeneration with circumferential osteophytes and protruding disc material, more prominent towards the left. Stenosis of the left lateral recess that could cause nerve root compression on that side. - L4-5:  Disc degeneration with circumferential osteophytes and protruding disc material.  Foraminal stenosis on the left that would seem likely to compress the left L4 nerve root. - L5-S1:  No disc pathology.  Facet degeneration without neural Compression.  11/21/15 MRI lumbar spine 1. Interval decompressive laminectomies at L2-3 and L3-4 with decompression of the spinal canal at both levels. A broad-based left-sided disc protrusion at L3-4 has partially involuted, although contributes to residual narrowing of the left lateral recess and left foramen with potential nerve root encroachment. 2. Stable spondylosis, facet and ligamentous hypertrophy at L4-5 contributing to mild narrowing of the lateral recesses and moderate left foraminal narrowing. 3. Stable facet hypertrophy at L5-S1 without resulting nerve root encroachment. 4. Stable mild spinal stenosis at L1-2 secondary to spondylosis. 5. No acute findings identified.      ASSESSMENT AND PLAN  56 y.o. year old female here with diabetes, hypercholesterolemia, obesity, tobacco abuse, degenerative lumbar spine  disease status post surgery 2015, with ongoing numbness, pain, weakness in bilateral hips and thighs, low back pain, especially worse when standing or walking.  Signs and symptoms concerning for lumbar spinal stenosis and lumbar radiculopathy.  Patient may also have meralgia paresthetica and diabetic neuropathy.  Patient already under pain management clinic with Dr. Loletta Parish.  Patient not interested in further surgical treatment options.  Dx:  1. Lumbar radiculopathy   2. Meralgia paresthetica, unspecified laterality   3. Diabetic polyneuropathy associated with type 2 diabetes mellitus (HCC)      PLAN:  - optimize general health, wellness and weight - encouraged patient to improve nutrition and exercise; stop smoking - consider PT, nutrition consult, weight loss clinic consult - continue pain mgmt per Dr. Loletta Parish (for lumbar spine issues) - no role for EMG or MRI at this time (would not change mgmt; already has evidence for lumbar radiculopathy and diabetic neuropathy by history and exam)  Return if symptoms worsen or fail to improve, for return to PCP.    Suanne Marker, MD 07/15/2017, 11:11 AM Certified in Neurology, Neurophysiology and Neuroimaging  [facility] [address] [place], [place] [address] [phone]

## 2017-07-15 NOTE — Patient Instructions (Addendum)
Thank you for coming to see [place] at [facility]. I hope we have been able to provide you high quality care today.  You may receive a patient satisfaction survey over the next few weeks. We would appreciate your feedback and comments so that we may continue to improve ourselves and the health of our patients.   - try to improve nutrition and exercise  - stop smoking  - consider physical therapy, nutrition consult, weight loss clinic consult  - continue pain mgmt per Dr. Wilford Grist (for lumbar spine issues)   ~~~~~~~~~~~~~~~~~~~~~~~~~~~~~~~~~~~~~~~~~~~~~~~~~~~~~~~~~~~~~~~~~  DR. Enedelia Martorelli'S GUIDE TO HAPPY AND HEALTHY LIVING These are some of my general health and wellness recommendations. Some of them may apply to you better than others. Please use common sense as you try these suggestions and feel free to ask me any questions.   ACTIVITY/FITNESS Mental, social, emotional and physical stimulation are very important for brain and body health. Try learning a new activity (arts, music, language, sports, games).  Keep moving your body to the best of your abilities. You can do this at home, inside or outside, the park, community center, gym or anywhere you like. Consider a physical therapist or personal trainer to get started. Consider the app Sworkit. Fitness trackers such as smart-watches, smart-phones or Fitbits can help as well.   NUTRITION Eat more plants: colorful vegetables, nuts, seeds and berries.  Eat less sugar, salt, preservatives and processed foods.  Avoid toxins such as cigarettes and alcohol.  Drink water when you are thirsty. Warm water with a slice of lemon is an excellent morning drink to start the day.  Consider these websites for more information The Nutrition Source (https://www.henry-hernandez.biz/) Precision Nutrition (WindowBlog.ch)   RELAXATION Consider practicing mindfulness meditation or other  relaxation techniques such as deep breathing, prayer, yoga, tai chi, massage. See website mindful.org or the apps Headspace or Calm to help get started.   SLEEP Try to get at least 7-8+ hours sleep per day. Regular exercise and reduced caffeine will help you sleep better. Practice good sleep hygeine techniques. See website sleep.org for more information.   PLANNING Prepare estate planning, living will, healthcare POA documents. Sometimes this is best planned with the help of an attorney. Theconversationproject.org and agingwithdignity.org are excellent resources.

## 2018-03-19 ENCOUNTER — Encounter (INDEPENDENT_AMBULATORY_CARE_PROVIDER_SITE_OTHER): Payer: Self-pay | Admitting: Diagnostic Neuroimaging

## 2018-03-19 ENCOUNTER — Ambulatory Visit (INDEPENDENT_AMBULATORY_CARE_PROVIDER_SITE_OTHER): Payer: Medicare Other | Admitting: Diagnostic Neuroimaging

## 2018-03-19 DIAGNOSIS — R200 Anesthesia of skin: Secondary | ICD-10-CM

## 2018-03-19 DIAGNOSIS — Z0289 Encounter for other administrative examinations: Secondary | ICD-10-CM

## 2018-03-20 NOTE — Procedures (Signed)
[facility]  NCS (NERVE CONDUCTION STUDY) WITH EMG (ELECTROMYOGRAPHY) REPORT   STUDY DATE: 03/19/18 PATIENT NAME: Sharon Calderon DOB: May 09, 1961 MRN: [id number]  ORDERING CLINICIAN: Darryl LentAmanda Taylor, PA  TECHNOLOGIST: Charlesetta IvoryBeau Handy ELECTROMYOGRAPHER: Glenford BayleyVikram R. Penumalli, MD  CLINICAL INFORMATION: 57 year old female with lower extreme knee pain and numbness.  FINDINGS: NERVE CONDUCTION STUDY:  Bilateral peroneal and tibial motor responses are normal.  Bilateral tibial F wave latencies are normal.  Bilateral sural and superficial peroneal sensory responses normal.   NEEDLE ELECTROMYOGRAPHY:  Left vastus medialis, tibials anterior, gastrocnemius muscles are normal.    IMPRESSION:   This is a normal study.  No electrodiagnostic evidence of large fiber neuropathy or lumbar radiculopathy at this time.    INTERPRETING PHYSICIAN:  Suanne MarkerVIKRAM R. PENUMALLI, MD Certified in Neurology, Neurophysiology and Neuroimaging  [facility] [address], [address] [place], [place] [address] [phone]   [facility]    Nerve / Sites Muscle Latency Ref. Amplitude Ref. Rel Amp Segments Distance Velocity Ref. Area    ms ms mV mV %  cm m/s m/s mVms  L Peroneal - EDB     Ankle EDB 3.6 ?6.5 5.8 ?2.0 100 Ankle - EDB 9   13.8     Fib head EDB 8.3  5.1  87.3 Fib head - Ankle 23 49 ?44 11.1     Pop fossa EDB 10.4  4.8  94.3 Pop fossa - Fib head 10 48 ?44 9.9         Pop fossa - Ankle      R Peroneal - EDB     Ankle EDB 3.2 ?6.5 7.0 ?2.0 100 Ankle - EDB 9   18.5     Fib head EDB 8.2  6.0  86.4 Fib head - Ankle 23 46 ?44 17.9     Pop fossa EDB 10.4  6.5  107 Pop fossa - Fib head 10 45 ?44 21.1         Pop fossa - Ankle      L Tibial - AH     Ankle AH 4.3 ?5.8 6.3 ?4.0 100 Ankle - AH 9   17.2     Pop fossa AH 11.2  4.7  74.2 Pop fossa - Ankle 31 45 ?41 15.9  R Tibial - AH     Ankle AH 4.0 ?5.8 5.8 ?4.0 100 Ankle - AH 9   11.6     Pop fossa AH 10.6  3.6  62.1  Pop fossa - Ankle 31 47 ?41 11.0             SNC    Nerve / Sites Rec. Site Peak Lat Ref.  Amp Ref. Segments Distance    ms ms V V  cm  L Sural - Ankle (Calf)     Calf Ankle 3.0 ?4.4 12 ?6 Calf - Ankle 14  R Sural - Ankle (Calf)     Calf Ankle 3.1 ?4.4 13 ?6 Calf - Ankle 14  L Superficial peroneal - Ankle     Lat leg Ankle 3.4 ?4.4 10 ?6 Lat leg - Ankle 14  R Superficial peroneal - Ankle     Lat leg Ankle 3.2 ?4.4 9 ?6 Lat leg - Ankle 14              F  Wave    Nerve F Lat Ref.   ms ms  L Tibial - AH 43.2 ?56.0  R Tibial - AH 43.8 ?56.0  EMG full       EMG Summary Table    Spontaneous MUAP Recruitment  Muscle IA Fib PSW Fasc Other Amp Dur. Poly Pattern  L. Vastus medialis Normal None None None _______ Normal Normal Normal Normal  L. Tibialis anterior Normal None None None _______ Normal Normal Normal Normal  L. Gastrocnemius (Medial head) Normal None None None _______ Normal Normal Normal Normal

## 2020-07-12 ENCOUNTER — Telehealth: Payer: Self-pay | Admitting: Diagnostic Neuroimaging

## 2020-07-12 NOTE — Telephone Encounter (Signed)
If it is for a new issue it is Ok, but if it is for the same complains of leg paresthsia, it has worked up already

## 2020-07-12 NOTE — Telephone Encounter (Signed)
Patient is requesting a provider switch from Dr. Marjory Lies to Dr. Terrace Arabia. Please advise if this is acceptable.

## 2020-10-15 ENCOUNTER — Emergency Department (HOSPITAL_BASED_OUTPATIENT_CLINIC_OR_DEPARTMENT_OTHER): Payer: Medicare Other

## 2020-10-15 ENCOUNTER — Other Ambulatory Visit: Payer: Self-pay

## 2020-10-15 ENCOUNTER — Emergency Department (HOSPITAL_BASED_OUTPATIENT_CLINIC_OR_DEPARTMENT_OTHER)
Admission: EM | Admit: 2020-10-15 | Discharge: 2020-10-15 | Disposition: A | Discharge status: Home / Self Care | Payer: Medicare Other | Attending: Emergency Medicine | Admitting: Emergency Medicine

## 2020-10-15 ENCOUNTER — Encounter (HOSPITAL_BASED_OUTPATIENT_CLINIC_OR_DEPARTMENT_OTHER): Payer: Self-pay

## 2020-10-15 DIAGNOSIS — F17210 Nicotine dependence, cigarettes, uncomplicated: Secondary | ICD-10-CM | POA: Diagnosis not present

## 2020-10-15 DIAGNOSIS — Z79899 Other long term (current) drug therapy: Secondary | ICD-10-CM | POA: Diagnosis not present

## 2020-10-15 DIAGNOSIS — N183 Chronic kidney disease, stage 3 unspecified: Secondary | ICD-10-CM | POA: Insufficient documentation

## 2020-10-15 DIAGNOSIS — S8992XA Unspecified injury of left lower leg, initial encounter: Secondary | ICD-10-CM | POA: Insufficient documentation

## 2020-10-15 DIAGNOSIS — M25462 Effusion, left knee: Secondary | ICD-10-CM | POA: Diagnosis not present

## 2020-10-15 DIAGNOSIS — I2510 Atherosclerotic heart disease of native coronary artery without angina pectoris: Secondary | ICD-10-CM | POA: Insufficient documentation

## 2020-10-15 DIAGNOSIS — E039 Hypothyroidism, unspecified: Secondary | ICD-10-CM | POA: Diagnosis not present

## 2020-10-15 DIAGNOSIS — E1122 Type 2 diabetes mellitus with diabetic chronic kidney disease: Secondary | ICD-10-CM | POA: Insufficient documentation

## 2020-10-15 DIAGNOSIS — J449 Chronic obstructive pulmonary disease, unspecified: Secondary | ICD-10-CM | POA: Insufficient documentation

## 2020-10-15 DIAGNOSIS — I129 Hypertensive chronic kidney disease with stage 1 through stage 4 chronic kidney disease, or unspecified chronic kidney disease: Secondary | ICD-10-CM | POA: Insufficient documentation

## 2020-10-15 DIAGNOSIS — J45909 Unspecified asthma, uncomplicated: Secondary | ICD-10-CM | POA: Insufficient documentation

## 2020-10-15 DIAGNOSIS — W01198A Fall on same level from slipping, tripping and stumbling with subsequent striking against other object, initial encounter: Secondary | ICD-10-CM | POA: Insufficient documentation

## 2020-10-15 DIAGNOSIS — Z7984 Long term (current) use of oral hypoglycemic drugs: Secondary | ICD-10-CM | POA: Diagnosis not present

## 2020-10-15 DIAGNOSIS — Z7982 Long term (current) use of aspirin: Secondary | ICD-10-CM | POA: Insufficient documentation

## 2020-10-15 MED ORDER — PREDNISONE 20 MG PO TABS: 40.0000 mg | ORAL_TABLET | Freq: Every day | ORAL | 0 refills | Status: AC

## 2020-10-15 NOTE — Discharge Instructions (Addendum)
Take prednisone as prescribed. Make sure you check your blood sugar frequently, as it may cause it to rise. If so, stop the medicine and talk to your doctor.  Apply ice for 20 minutes at a time 3 times a day. Keep you leg elevated, and try to walk as little as possible. Use the brace and your walker when walking.  Follow up with the orthopedic doctor in 1 week if symptoms are not improving.  Return to the ER if you develop fevers, numbness, severe worsening pain, or any new, worsening, or concerning symptoms.

## 2020-10-15 NOTE — ED Provider Notes (Signed)
[facility] EMERGENCY DEPARTMENT Provider Note   CSN: [id number] Arrival date & time: 10/15/20  1312     History Chief Complaint  Patient presents with  . Knee Injury    Sharon Calderon is a 60 y.o. female presenting for evaluation of L knee pain.   Pt states yesterday she had a mechanical fall and hyperflexed her L knee. Since then she has had persistent pain and swelling. She has been using ice and elevation without improvemnet of sxs. She denies pain elsewhere. She did hit her head, but no HA, LOC, and she is not on blood thinners. She denies numbness or tingling. Pain is constant, worse with movement and weightbearing. She has an ortho doctor, but has not seem then for years and does not remember their name. She has had injury of this knee before.   HPI     Past Medical History:  Diagnosis Date  . Arthritis   . Asthma   . CAD (coronary artery disease)   . Carpal tunnel syndrome    bilateral  . CD (celiac disease)   . COPD (chronic obstructive pulmonary disease) (HCC)    Dr Charlyne Quale in [facility] Pulmologist  . DDD (degenerative disc disease), lumbar   . Depression   . Diabetes mellitus without complication (HCC)   . GERD (gastroesophageal reflux disease)   . GERD (gastroesophageal reflux disease)   . Hemorrhoids 02/18/2008  . History of kidney stones   . Hypercholesterolemia   . Hypertension   . Hypothyroidism   . OSA (obstructive sleep apnea)   . Paresthesia   . PVD (peripheral vascular disease) (HCC)   . Shingles   . Shortness of breath   . Stroke [facility])    TIA- right arm and bottom of right foot numbness.    Patient Active Problem List   Diagnosis Date Noted  . Primary osteoarthritis of left knee 06/20/2014  . Lumbar stenosis 03/04/2014  . Diabetes (HCC) 06/25/2013    Past Surgical History:  Procedure Laterality Date  . ABDOMINAL HYSTERECTOMY  03/18/2006  . CHOLECYSTECTOMY  09/04/08  . CYSTOSCOPY    . INCONTINENCE SURGERY    . LUMBAR  LAMINECTOMY/DECOMPRESSION MICRODISCECTOMY Bilateral 03/04/2014   Procedure: LUMBAR LAMINECTOMY/DECOMPRESSION MICRODISCECTOMY 3 LEVELS lumbar one/two, two/three, three/ four;  Surgeon: Carmela Hurt, MD;  Location: [facility];  Service: Neurosurgery;  Laterality: Bilateral;  . URETERAL STENT PLACEMENT       OB History   No obstetric history on file.     Family History  Problem Relation Age of Onset  . Hypertension Mother   . Heart disease Father     Social History   Tobacco Use  . Smoking status: Current Every Day Smoker    Packs/day: 1.00    Years: 20.00    Pack years: 20.00  . Smokeless tobacco: Never Used  Substance Use Topics  . Alcohol use: No  . Drug use: No    Home Medications Prior to Admission medications   Medication Sig Start Date End Date Taking? Authorizing Provider  predniSONE (DELTASONE) 20 MG tablet Take 2 tablets (40 mg total) by mouth daily for 5 days. 10/15/20 10/20/20 Yes Kelee Cunningham, PA-C  aspirin EC 81 MG tablet Take 81 mg by mouth daily.    [provider]  atorvastatin (LIPITOR) 40 MG tablet Take 40 mg by mouth daily.    [provider]  calcium carbonate (OS-CAL) 600 MG TABS tablet Take 600 mg by mouth daily with breakfast.  [provider]  divalproex (DEPAKOTE ER) 500 MG 24 hr tablet Take 1,000 mg by mouth daily.    [provider]  glipiZIDE (GLUCOTROL) 5 MG tablet Take 5 mg by mouth 2 (two) times daily before a meal.    [provider]  HYDROcodone-acetaminophen (NORCO) 10-325 MG tablet  06/30/17   [provider]  levothyroxine (SYNTHROID, LEVOTHROID) 50 MCG tablet Take 50 mcg by mouth daily before breakfast.    [provider]  lisinopril-hydrochlorothiazide (PRINZIDE,ZESTORETIC) 20-12.5 MG tablet daily. 06/06/17   [provider]  loratadine (CLARITIN) 10 MG tablet Take 10 mg by mouth daily.    [provider]  LYRICA 100 MG capsule 100 mg 2 (two) times  daily. 07/11/17   [provider]  Magnesium Hydroxide (MAGNESIA PO) Take 1 tablet by mouth daily.    [provider]  mesalamine (LIALDA) 1.2 G EC tablet Take 1.2 g by mouth 2 (two) times daily.    [provider]  metFORMIN (GLUCOPHAGE) 500 MG tablet Take 1,000 mg by mouth 2 (two) times daily with a meal.    [provider]  metoprolol tartrate (LOPRESSOR) 25 MG tablet Take 25 mg by mouth 2 (two) times daily.    [provider]  mirabegron ER (MYRBETRIQ) 50 MG TB24 tablet Take 50 mg by mouth daily.    [provider]  Multiple Vitamins-Minerals (MULTIVITAMIN WITH MINERALS) tablet Take 1 tablet by mouth daily.    [provider]  omeprazole (PRILOSEC) 40 MG capsule Take 40 mg by mouth daily.    [provider]  Phentermine HCl 37.5 MG TBDP Take 1 tablet by mouth daily.    [provider]  temazepam (RESTORIL) 30 MG capsule Take 30 mg by mouth at bedtime as needed for sleep.    [provider]  tiZANidine (ZANAFLEX) 4 MG tablet Take 1 tablet (4 mg total) by mouth every 6 (six) hours as needed for muscle spasms. 03/05/14   Coletta Memos, MD  Vilazodone HCl (VIIBRYD) 40 MG TABS Take 40 mg by mouth daily.    [provider]  zolpidem (AMBIEN) 10 MG tablet  07/07/17   [provider]    Allergies    Sulfa antibiotics  Review of Systems   Review of Systems  Musculoskeletal: Positive for arthralgias, gait problem and joint swelling.  Neurological: Negative for numbness.    Physical Exam Updated Vital Signs BP (!) 100/59 (BP Location: Left Arm)   Pulse 87   Temp 98.1 F (36.7 C) (Oral)   Resp 20   Ht 4\' 11"  (1.499 m)   Wt 72.6 kg   SpO2 96%   BMI 32.32 kg/m   Physical Exam Vitals and nursing note reviewed.  Constitutional:      General: She is not in acute distress.    Appearance: She is well-developed and well-nourished.     Comments: Resting in the char in NAD  HENT:      Head: Normocephalic and atraumatic.  Eyes:     Extraocular Movements: EOM normal.  Pulmonary:     Effort: Pulmonary effort is normal.  Abdominal:     General: There is no distension.  Musculoskeletal:        General: Swelling and tenderness present.     Cervical back: Normal range of motion.     Comments: Swelling of the L knee. No erythema or warmth. Limited passive and active ROM with both flexion and extension due to pain and swelling. No  ttp fo lower leg. Distal sensation intact bilaterally. Good pedal pulses.   Skin:    General: Skin is warm.     Capillary Refill: Capillary refill takes less than 2 seconds.     Findings: No rash.  Neurological:     Mental Status: She is alert and oriented to person, place, and time.  Psychiatric:        Mood and Affect: Mood and affect normal.     ED Results / Procedures / Treatments   Labs (all labs ordered are listed, but only abnormal results are displayed) Labs Reviewed - No data to display  EKG None  Radiology DG Knee Complete 4 Views Left  Result Date: 10/15/2020 CLINICAL DATA:  Fall.  Pain. EXAM: LEFT KNEE - COMPLETE 4+ VIEW COMPARISON:  03/12/2005 FINDINGS: Advanced, 3 compartment osteoarthritis with joint space narrowing and osteophyte formation. No acute fracture or dislocation. Small to moderate suprapatellar joint effusion. Intra-articular loose body of 9 mm. IMPRESSION: Advanced osteoarthritis with joint effusion. Electronically Signed   By: Jeronimo Greaves M.D.   On: 10/15/2020 13:58    Procedures Procedures   Medications Ordered in ED Medications - No data to display  ED Course  I have reviewed the triage vital signs and the nursing notes.  Pertinent labs & imaging results that were available during my care of the patient were reviewed by me and considered in my medical decision making (see chart for details).    MDM Rules/Calculators/A&P                          Pt presenting for evaluation of L knee pain. On exam,  pt appears nontoxic. Is neurovascularly intact. Xray obtained from triage interpreted by me, no fx or dislocation. There is a joint effusion. exam is not c/w septic joint, likely effusion 2/2/ trauma. discussed with pt. discussed RICE. Will give knee sleeve and encourage pt to use home walker. As pt has CKD3, will give short course of prednisone for pain and swelling. She has a h/o DM, but has done well with prednisone in the past. discussed f/u with ortho. At this time, pt appears safe for d/c. Return precautions given. Pt states she understands and agrees to plan.    Final Clinical Impression(s) / ED Diagnoses Final diagnoses:  Effusion of left knee  Injury of left knee, initial encounter    Rx / DC Orders ED Discharge Orders         Ordered    predniSONE (DELTASONE) 20 MG tablet  Daily        10/15/20 1446           Alveria Apley, PA-C 10/15/20 1448    Benjiman Core, MD 10/15/20 1523

## 2020-10-15 NOTE — ED Triage Notes (Signed)
Pt states she tripped and fell yesterday and left knee bent under her when she fell. Now complains of swelling, pain to posterior knee and difficulty ambulating without pain. States bumped head, denies thinners, no LOC

## 2021-11-13 IMAGING — CR DG KNEE COMPLETE 4+V*L*
5 series · 5 of 5 positions shown · non-contrast
Comparison: 03/12/2005

CLINICAL DATA: Fall.  Pain.

EXAM:
LEFT KNEE - COMPLETE 4+ VIEW

[t knee ap left *]
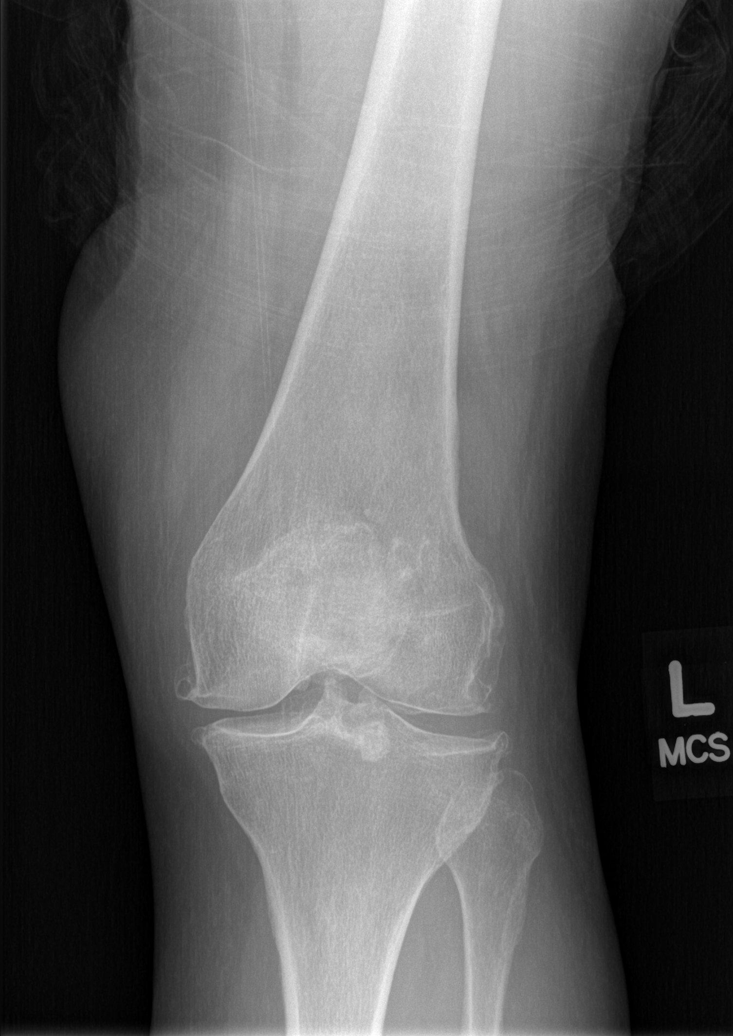

[t knee oblique left *]
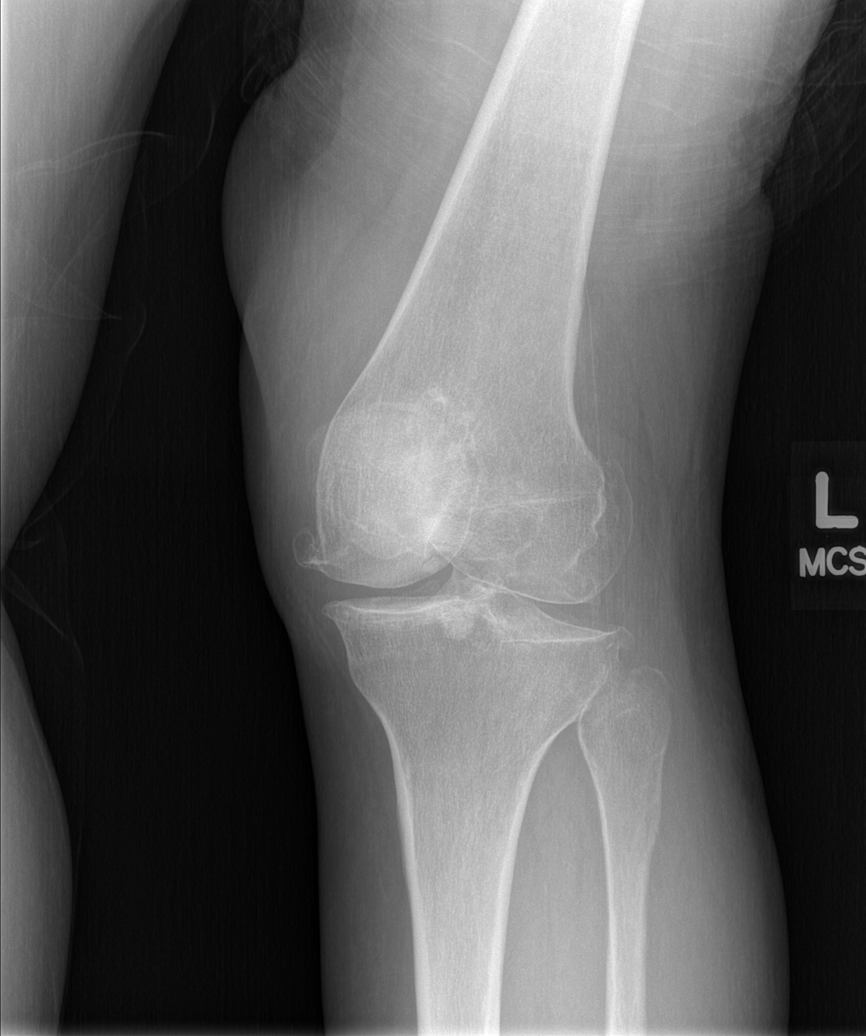

[t knee oblique left]
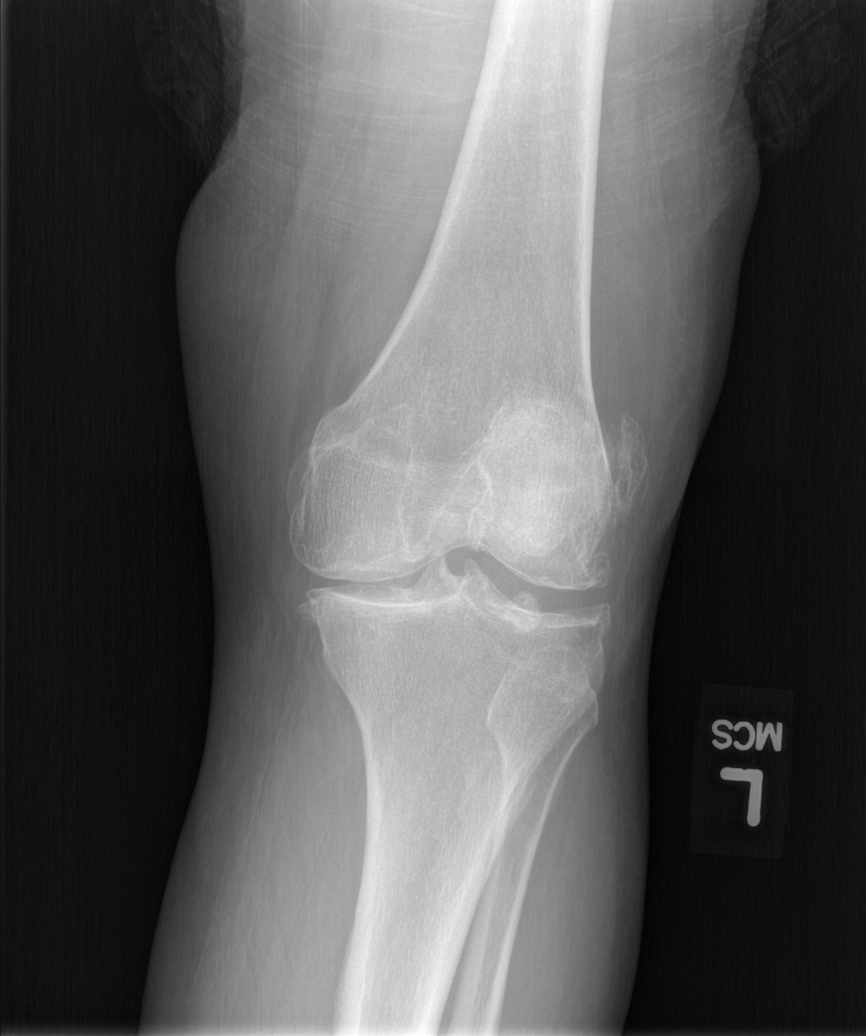

[t knee lat left * (1 of 2)]
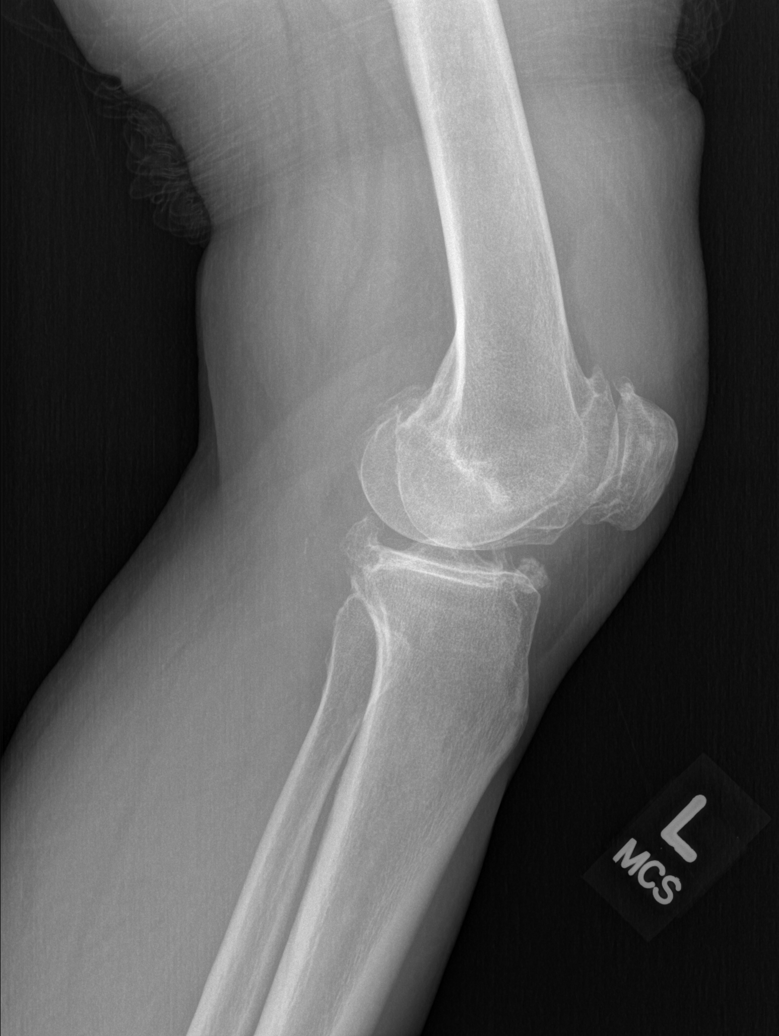

[t knee lat left * (2 of 2)]
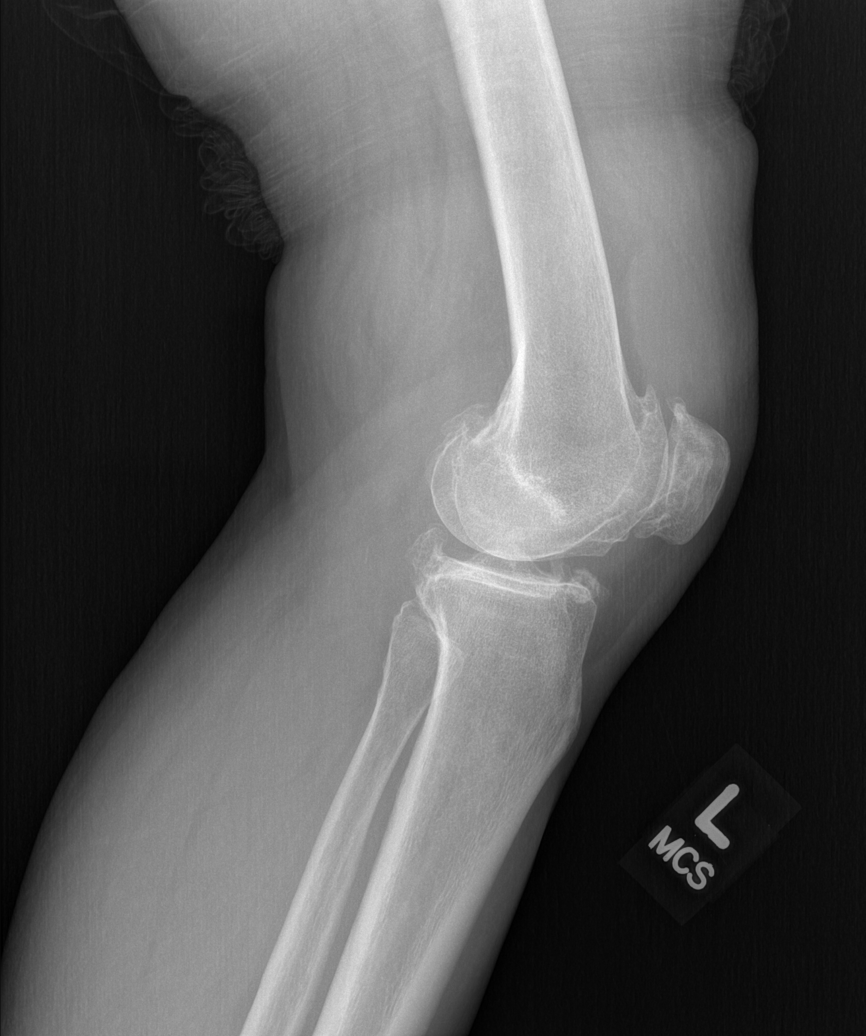

[5 of 5 positions shown; findings below may reference images not displayed]

FINDINGS: Advanced, 3 compartment osteoarthritis with joint space narrowing
and osteophyte formation. No acute fracture or dislocation. Small to
moderate suprapatellar joint effusion. Intra-articular loose body of
9 mm.
IMPRESSION: Advanced osteoarthritis with joint effusion.
